# Patient Record
Sex: Female | Born: 1935 | Race: Black or African American | Hispanic: No | Marital: Single | Smoking: Never smoker
Health system: Southern US, Community
[De-identification: ages and names within clinical notes are randomized; demographics above are authoritative.]

## PROBLEM LIST (undated history)

## (undated) DIAGNOSIS — C50919 Malignant neoplasm of unspecified site of unspecified female breast: Secondary | ICD-10-CM

## (undated) DIAGNOSIS — M8580 Other specified disorders of bone density and structure, unspecified site: Secondary | ICD-10-CM

## (undated) DIAGNOSIS — K635 Polyp of colon: Secondary | ICD-10-CM

## (undated) DIAGNOSIS — I10 Essential (primary) hypertension: Secondary | ICD-10-CM

## (undated) DIAGNOSIS — F0390 Unspecified dementia without behavioral disturbance: Secondary | ICD-10-CM

## (undated) DIAGNOSIS — C189 Malignant neoplasm of colon, unspecified: Secondary | ICD-10-CM

## (undated) DIAGNOSIS — Z923 Personal history of irradiation: Secondary | ICD-10-CM

## (undated) HISTORY — DX: Other specified disorders of bone density and structure, unspecified site: M85.80

## (undated) HISTORY — DX: Polyp of colon: K63.5

## (undated) HISTORY — DX: Malignant neoplasm of unspecified site of unspecified female breast: C50.919

## (undated) HISTORY — PX: OTHER SURGICAL HISTORY: SHX169

## (undated) HISTORY — DX: Essential (primary) hypertension: I10

## (undated) HISTORY — PX: COLONOSCOPY: SHX174

## (undated) HISTORY — DX: Malignant neoplasm of colon, unspecified: C18.9

## (undated) HISTORY — PX: ABDOMINAL HYSTERECTOMY: SHX81

---

## 1988-10-19 DIAGNOSIS — I10 Essential (primary) hypertension: Secondary | ICD-10-CM

## 1988-10-19 HISTORY — DX: Essential (primary) hypertension: I10

## 1995-10-20 HISTORY — PX: BREAST BIOPSY: SHX20

## 2005-03-26 ENCOUNTER — Ambulatory Visit: Payer: Self-pay | Admitting: General Surgery

## 2005-10-19 DIAGNOSIS — C50919 Malignant neoplasm of unspecified site of unspecified female breast: Secondary | ICD-10-CM

## 2005-10-19 HISTORY — DX: Malignant neoplasm of unspecified site of unspecified female breast: C50.919

## 2005-10-19 HISTORY — PX: BREAST SURGERY: SHX581

## 2005-10-19 HISTORY — PX: BREAST BIOPSY: SHX20

## 2005-10-22 ENCOUNTER — Ambulatory Visit: Payer: Self-pay | Admitting: Family Medicine

## 2006-04-08 ENCOUNTER — Ambulatory Visit: Payer: Self-pay | Admitting: General Surgery

## 2006-04-19 ENCOUNTER — Ambulatory Visit: Payer: Self-pay | Admitting: General Surgery

## 2006-04-29 ENCOUNTER — Ambulatory Visit: Payer: Self-pay | Admitting: Gastroenterology

## 2006-05-11 ENCOUNTER — Other Ambulatory Visit: Payer: Self-pay

## 2006-05-11 ENCOUNTER — Ambulatory Visit: Payer: Self-pay | Admitting: General Surgery

## 2006-05-13 ENCOUNTER — Ambulatory Visit: Payer: Self-pay | Admitting: General Surgery

## 2006-05-26 ENCOUNTER — Ambulatory Visit: Payer: Self-pay | Admitting: Radiation Oncology

## 2006-06-19 ENCOUNTER — Ambulatory Visit: Payer: Self-pay | Admitting: Radiation Oncology

## 2006-07-19 ENCOUNTER — Ambulatory Visit: Payer: Self-pay | Admitting: Radiation Oncology

## 2006-08-19 ENCOUNTER — Ambulatory Visit: Payer: Self-pay | Admitting: Radiation Oncology

## 2006-11-04 ENCOUNTER — Ambulatory Visit: Payer: Self-pay | Admitting: General Surgery

## 2007-02-02 ENCOUNTER — Ambulatory Visit: Payer: Self-pay | Admitting: Radiation Oncology

## 2007-02-17 ENCOUNTER — Ambulatory Visit: Payer: Self-pay | Admitting: Radiation Oncology

## 2007-03-21 ENCOUNTER — Ambulatory Visit: Payer: Self-pay | Admitting: General Surgery

## 2007-08-08 ENCOUNTER — Ambulatory Visit: Payer: Self-pay | Admitting: Radiation Oncology

## 2007-08-20 ENCOUNTER — Ambulatory Visit: Payer: Self-pay | Admitting: Radiation Oncology

## 2007-10-20 DIAGNOSIS — C189 Malignant neoplasm of colon, unspecified: Secondary | ICD-10-CM

## 2007-10-20 HISTORY — PX: COLECTOMY: SHX59

## 2007-10-20 HISTORY — DX: Malignant neoplasm of colon, unspecified: C18.9

## 2008-03-19 ENCOUNTER — Ambulatory Visit: Payer: Self-pay | Admitting: General Surgery

## 2008-04-02 ENCOUNTER — Ambulatory Visit: Payer: Self-pay | Admitting: General Surgery

## 2008-04-11 ENCOUNTER — Other Ambulatory Visit: Payer: Self-pay

## 2008-04-11 ENCOUNTER — Ambulatory Visit: Payer: Self-pay | Admitting: General Surgery

## 2008-04-18 ENCOUNTER — Inpatient Hospital Stay: Payer: Self-pay | Admitting: General Surgery

## 2008-09-18 ENCOUNTER — Ambulatory Visit: Payer: Self-pay | Admitting: Radiation Oncology

## 2008-10-15 ENCOUNTER — Ambulatory Visit: Payer: Self-pay | Admitting: Radiation Oncology

## 2008-10-19 ENCOUNTER — Ambulatory Visit: Payer: Self-pay | Admitting: Radiation Oncology

## 2009-01-30 ENCOUNTER — Inpatient Hospital Stay: Payer: Self-pay | Admitting: Internal Medicine

## 2009-02-26 ENCOUNTER — Ambulatory Visit: Payer: Self-pay | Admitting: General Surgery

## 2009-03-21 ENCOUNTER — Ambulatory Visit: Payer: Self-pay | Admitting: General Surgery

## 2009-09-18 ENCOUNTER — Ambulatory Visit: Payer: Self-pay | Admitting: Radiation Oncology

## 2009-09-30 ENCOUNTER — Ambulatory Visit: Payer: Self-pay | Admitting: Radiation Oncology

## 2009-10-19 ENCOUNTER — Ambulatory Visit: Payer: Self-pay | Admitting: Radiation Oncology

## 2009-11-10 ENCOUNTER — Ambulatory Visit: Payer: Self-pay | Admitting: Internal Medicine

## 2010-03-27 ENCOUNTER — Ambulatory Visit: Payer: Self-pay | Admitting: General Surgery

## 2011-03-31 ENCOUNTER — Ambulatory Visit: Payer: Self-pay | Admitting: General Surgery

## 2011-10-27 DIAGNOSIS — H4010X0 Unspecified open-angle glaucoma, stage unspecified: Secondary | ICD-10-CM | POA: Diagnosis not present

## 2011-12-01 DIAGNOSIS — H4010X0 Unspecified open-angle glaucoma, stage unspecified: Secondary | ICD-10-CM | POA: Diagnosis not present

## 2012-01-05 DIAGNOSIS — H4010X0 Unspecified open-angle glaucoma, stage unspecified: Secondary | ICD-10-CM | POA: Diagnosis not present

## 2012-02-09 DIAGNOSIS — H4010X0 Unspecified open-angle glaucoma, stage unspecified: Secondary | ICD-10-CM | POA: Diagnosis not present

## 2012-04-04 ENCOUNTER — Ambulatory Visit: Payer: Self-pay | Admitting: General Surgery

## 2012-04-04 DIAGNOSIS — Z853 Personal history of malignant neoplasm of breast: Secondary | ICD-10-CM | POA: Diagnosis not present

## 2012-04-12 DIAGNOSIS — Z85038 Personal history of other malignant neoplasm of large intestine: Secondary | ICD-10-CM | POA: Diagnosis not present

## 2012-05-13 DIAGNOSIS — H4010X0 Unspecified open-angle glaucoma, stage unspecified: Secondary | ICD-10-CM | POA: Diagnosis not present

## 2012-06-07 DIAGNOSIS — E7800 Pure hypercholesterolemia, unspecified: Secondary | ICD-10-CM | POA: Diagnosis not present

## 2012-06-07 DIAGNOSIS — J309 Allergic rhinitis, unspecified: Secondary | ICD-10-CM | POA: Diagnosis not present

## 2012-06-07 DIAGNOSIS — R7309 Other abnormal glucose: Secondary | ICD-10-CM | POA: Diagnosis not present

## 2012-06-07 DIAGNOSIS — I10 Essential (primary) hypertension: Secondary | ICD-10-CM | POA: Diagnosis not present

## 2012-06-15 ENCOUNTER — Ambulatory Visit: Payer: Self-pay | Admitting: General Surgery

## 2012-06-15 DIAGNOSIS — Z85038 Personal history of other malignant neoplasm of large intestine: Secondary | ICD-10-CM | POA: Diagnosis not present

## 2012-06-15 DIAGNOSIS — Z803 Family history of malignant neoplasm of breast: Secondary | ICD-10-CM | POA: Diagnosis not present

## 2012-06-15 DIAGNOSIS — I10 Essential (primary) hypertension: Secondary | ICD-10-CM | POA: Diagnosis not present

## 2012-06-15 DIAGNOSIS — Z980 Intestinal bypass and anastomosis status: Secondary | ICD-10-CM | POA: Diagnosis not present

## 2012-06-15 DIAGNOSIS — Z09 Encounter for follow-up examination after completed treatment for conditions other than malignant neoplasm: Secondary | ICD-10-CM | POA: Diagnosis not present

## 2012-06-15 DIAGNOSIS — D371 Neoplasm of uncertain behavior of stomach: Secondary | ICD-10-CM | POA: Diagnosis not present

## 2012-06-15 DIAGNOSIS — Z79899 Other long term (current) drug therapy: Secondary | ICD-10-CM | POA: Diagnosis not present

## 2012-06-15 DIAGNOSIS — IMO0002 Reserved for concepts with insufficient information to code with codable children: Secondary | ICD-10-CM | POA: Diagnosis not present

## 2012-06-15 DIAGNOSIS — Z853 Personal history of malignant neoplasm of breast: Secondary | ICD-10-CM | POA: Diagnosis not present

## 2012-06-15 DIAGNOSIS — D126 Benign neoplasm of colon, unspecified: Secondary | ICD-10-CM | POA: Diagnosis not present

## 2012-06-17 LAB — PATHOLOGY REPORT

## 2012-07-22 DIAGNOSIS — Z23 Encounter for immunization: Secondary | ICD-10-CM | POA: Diagnosis not present

## 2012-10-25 DIAGNOSIS — J111 Influenza due to unidentified influenza virus with other respiratory manifestations: Secondary | ICD-10-CM | POA: Diagnosis not present

## 2012-10-25 DIAGNOSIS — K5289 Other specified noninfective gastroenteritis and colitis: Secondary | ICD-10-CM | POA: Diagnosis not present

## 2012-12-27 DIAGNOSIS — H4010X0 Unspecified open-angle glaucoma, stage unspecified: Secondary | ICD-10-CM | POA: Diagnosis not present

## 2013-01-24 DIAGNOSIS — E7800 Pure hypercholesterolemia, unspecified: Secondary | ICD-10-CM | POA: Diagnosis not present

## 2013-01-24 DIAGNOSIS — I10 Essential (primary) hypertension: Secondary | ICD-10-CM | POA: Diagnosis not present

## 2013-01-24 DIAGNOSIS — R413 Other amnesia: Secondary | ICD-10-CM | POA: Diagnosis not present

## 2013-01-31 DIAGNOSIS — H4010X0 Unspecified open-angle glaucoma, stage unspecified: Secondary | ICD-10-CM | POA: Diagnosis not present

## 2013-02-28 DIAGNOSIS — I10 Essential (primary) hypertension: Secondary | ICD-10-CM | POA: Diagnosis not present

## 2013-02-28 DIAGNOSIS — R9431 Abnormal electrocardiogram [ECG] [EKG]: Secondary | ICD-10-CM | POA: Diagnosis not present

## 2013-02-28 DIAGNOSIS — R079 Chest pain, unspecified: Secondary | ICD-10-CM | POA: Diagnosis not present

## 2013-02-28 DIAGNOSIS — F0390 Unspecified dementia without behavioral disturbance: Secondary | ICD-10-CM | POA: Diagnosis not present

## 2013-03-08 DIAGNOSIS — R9431 Abnormal electrocardiogram [ECG] [EKG]: Secondary | ICD-10-CM | POA: Diagnosis not present

## 2013-03-08 DIAGNOSIS — R42 Dizziness and giddiness: Secondary | ICD-10-CM | POA: Diagnosis not present

## 2013-03-08 DIAGNOSIS — I10 Essential (primary) hypertension: Secondary | ICD-10-CM | POA: Diagnosis not present

## 2013-03-08 DIAGNOSIS — I209 Angina pectoris, unspecified: Secondary | ICD-10-CM | POA: Diagnosis not present

## 2013-03-20 DIAGNOSIS — I10 Essential (primary) hypertension: Secondary | ICD-10-CM | POA: Diagnosis not present

## 2013-03-20 DIAGNOSIS — R011 Cardiac murmur, unspecified: Secondary | ICD-10-CM | POA: Diagnosis not present

## 2013-03-20 DIAGNOSIS — R9431 Abnormal electrocardiogram [ECG] [EKG]: Secondary | ICD-10-CM | POA: Diagnosis not present

## 2013-03-20 DIAGNOSIS — I200 Unstable angina: Secondary | ICD-10-CM | POA: Diagnosis not present

## 2013-04-04 DIAGNOSIS — R011 Cardiac murmur, unspecified: Secondary | ICD-10-CM | POA: Diagnosis not present

## 2013-04-04 DIAGNOSIS — R42 Dizziness and giddiness: Secondary | ICD-10-CM | POA: Diagnosis not present

## 2013-04-05 ENCOUNTER — Ambulatory Visit: Payer: Self-pay | Admitting: General Surgery

## 2013-04-05 DIAGNOSIS — Z853 Personal history of malignant neoplasm of breast: Secondary | ICD-10-CM | POA: Diagnosis not present

## 2013-04-05 DIAGNOSIS — R928 Other abnormal and inconclusive findings on diagnostic imaging of breast: Secondary | ICD-10-CM | POA: Diagnosis not present

## 2013-04-11 ENCOUNTER — Encounter: Payer: Self-pay | Admitting: General Surgery

## 2013-04-12 ENCOUNTER — Ambulatory Visit: Payer: Self-pay | Admitting: General Surgery

## 2013-04-24 ENCOUNTER — Encounter: Payer: Self-pay | Admitting: General Surgery

## 2013-04-24 ENCOUNTER — Ambulatory Visit (INDEPENDENT_AMBULATORY_CARE_PROVIDER_SITE_OTHER): Payer: Medicare Other | Admitting: General Surgery

## 2013-04-24 VITALS — BP 130/90 | HR 84 | Resp 12 | Ht 63.0 in | Wt 128.0 lb

## 2013-04-24 DIAGNOSIS — Z85038 Personal history of other malignant neoplasm of large intestine: Secondary | ICD-10-CM | POA: Insufficient documentation

## 2013-04-24 DIAGNOSIS — Z853 Personal history of malignant neoplasm of breast: Secondary | ICD-10-CM | POA: Diagnosis not present

## 2013-04-24 NOTE — Patient Instructions (Addendum)
Patient to return in 1 year with bilateral diagnostic mammogram.

## 2013-04-24 NOTE — Progress Notes (Signed)
Patient ID: Pamela Fox, female   DOB: [birth year], 79 y.o.   MRN: [id number]  Chief Complaint  Patient presents with  . Breast Cancer Long Term Follow Up    mammogram     HPI This is a 79 year old female here today following up from her mammogram done on 04/05/2013 with a birad category 2. Patient dose perform self breast checks and get regular mammogram. She states no complaints at this time. Here for her yearly follow up for right breast cancer and right colon cancer.  HPI  Past Medical History  Diagnosis Date  . Hypertension 1990  . Colon polyps   . Breast cancer 2007    Right, radiation treatment [facility]  . Colon cancer 2009    Past Surgical History  Procedure Laterality Date  . Colonoscopy  2009    polyps  . Colectomy  2009    colectomy for polyp wuth in-situ CA  . Salpingo oophorectmy    . Abdominal hysterectomy    . Breast surgery Right 2007    lumpectomy  . Breast biopsy  1997  . Breast biopsy Right 2007    Stereo    Family History  Problem Relation Age of Onset  . Cancer Paternal Aunt     breast    Social History History  Substance Use Topics  . Smoking status: Never Smoker   . Smokeless tobacco: Never Used  . Alcohol Use: No    No Known Allergies  Current Outpatient Prescriptions  Medication Sig Dispense Refill  . atorvastatin (LIPITOR) 40 MG tablet Take 1 tablet by mouth daily.      Marland Kitchen lisinopril-hydrochlorothiazide (PRINZIDE,ZESTORETIC) 20-12.5 MG per tablet Take 1 tablet by mouth daily.      . promethazine (PHENERGAN) 12.5 MG tablet Take 12.5 mg by mouth every 6 (six) hours as needed for nausea.       No current facility-administered medications for this visit.    Review of Systems Review of Systems  Constitutional: Negative.   Respiratory: Negative.   Cardiovascular: Negative.     Blood pressure 130/90, pulse 84, resp. rate 12, height 5\' 3"  (1.6 m), weight 128 lb (58.06 kg).  Physical Exam Physical Exam  Constitutional: She is oriented  to person, place, and time. She appears well-developed and well-nourished.  Eyes: Conjunctivae are normal. No scleral icterus.  Neck: Trachea normal. No mass and no thyromegaly present.  Cardiovascular: Normal rate, regular rhythm and normal heart sounds.   No murmur heard. Pulses:      Dorsalis pedis pulses are 2+ on the right side, and 2+ on the left side.       Posterior tibial pulses are 2+ on the right side, and 2+ on the left side.  No edema  Pulmonary/Chest: Effort normal and breath sounds normal. Right breast exhibits no inverted nipple, no mass, no nipple discharge, no skin change and no tenderness. Left breast exhibits no inverted nipple, no mass, no nipple discharge, no skin change and no tenderness.  Minimal associated scar at 11-12 o'clock of right breast from lumpectomy.  Abdominal: Soft. Normal appearance and bowel sounds are normal. There is no hepatosplenomegaly. There is no tenderness. No hernia.  Well healed mid abdominal vertical incision.   Lymphadenopathy:    She has no cervical adenopathy.    She has no axillary adenopathy.  Neurological: She is alert and oriented to person, place, and time.  Skin: Skin is warm and dry.    Data Reviewed Mammogram stable.   Assessment  Stable exam.    Plan    Continue yearly follow up.         SANKAR,SEEPLAPUTHUR G 04/24/2013, 9:15 PM

## 2013-05-09 DIAGNOSIS — R9431 Abnormal electrocardiogram [ECG] [EKG]: Secondary | ICD-10-CM | POA: Diagnosis not present

## 2013-05-09 DIAGNOSIS — R079 Chest pain, unspecified: Secondary | ICD-10-CM | POA: Diagnosis not present

## 2013-06-02 DIAGNOSIS — H4010X0 Unspecified open-angle glaucoma, stage unspecified: Secondary | ICD-10-CM | POA: Diagnosis not present

## 2013-07-11 DIAGNOSIS — E7800 Pure hypercholesterolemia, unspecified: Secondary | ICD-10-CM | POA: Diagnosis not present

## 2013-07-11 DIAGNOSIS — I10 Essential (primary) hypertension: Secondary | ICD-10-CM | POA: Diagnosis not present

## 2013-07-11 DIAGNOSIS — R413 Other amnesia: Secondary | ICD-10-CM | POA: Diagnosis not present

## 2013-07-11 DIAGNOSIS — Z23 Encounter for immunization: Secondary | ICD-10-CM | POA: Diagnosis not present

## 2013-09-21 DIAGNOSIS — H4010X0 Unspecified open-angle glaucoma, stage unspecified: Secondary | ICD-10-CM | POA: Diagnosis not present

## 2013-11-14 DIAGNOSIS — I10 Essential (primary) hypertension: Secondary | ICD-10-CM | POA: Diagnosis not present

## 2013-11-14 DIAGNOSIS — F0390 Unspecified dementia without behavioral disturbance: Secondary | ICD-10-CM | POA: Diagnosis not present

## 2013-11-14 DIAGNOSIS — Z23 Encounter for immunization: Secondary | ICD-10-CM | POA: Diagnosis not present

## 2013-11-14 DIAGNOSIS — E7800 Pure hypercholesterolemia, unspecified: Secondary | ICD-10-CM | POA: Diagnosis not present

## 2014-01-23 DIAGNOSIS — H4010X0 Unspecified open-angle glaucoma, stage unspecified: Secondary | ICD-10-CM | POA: Diagnosis not present

## 2014-03-13 DIAGNOSIS — I10 Essential (primary) hypertension: Secondary | ICD-10-CM | POA: Diagnosis not present

## 2014-03-13 DIAGNOSIS — F0390 Unspecified dementia without behavioral disturbance: Secondary | ICD-10-CM | POA: Diagnosis not present

## 2014-03-13 DIAGNOSIS — E7800 Pure hypercholesterolemia, unspecified: Secondary | ICD-10-CM | POA: Diagnosis not present

## 2014-05-03 DIAGNOSIS — I10 Essential (primary) hypertension: Secondary | ICD-10-CM | POA: Diagnosis not present

## 2014-05-03 DIAGNOSIS — E785 Hyperlipidemia, unspecified: Secondary | ICD-10-CM | POA: Diagnosis not present

## 2014-05-03 DIAGNOSIS — F03918 Unspecified dementia, unspecified severity, with other behavioral disturbance: Secondary | ICD-10-CM | POA: Diagnosis not present

## 2014-05-03 DIAGNOSIS — R002 Palpitations: Secondary | ICD-10-CM | POA: Diagnosis not present

## 2014-05-03 DIAGNOSIS — F0391 Unspecified dementia with behavioral disturbance: Secondary | ICD-10-CM | POA: Diagnosis not present

## 2014-05-08 DIAGNOSIS — Z853 Personal history of malignant neoplasm of breast: Secondary | ICD-10-CM | POA: Diagnosis not present

## 2014-05-08 DIAGNOSIS — I10 Essential (primary) hypertension: Secondary | ICD-10-CM | POA: Diagnosis not present

## 2014-05-08 DIAGNOSIS — Z09 Encounter for follow-up examination after completed treatment for conditions other than malignant neoplasm: Secondary | ICD-10-CM | POA: Diagnosis not present

## 2014-05-08 DIAGNOSIS — Z9889 Other specified postprocedural states: Secondary | ICD-10-CM | POA: Diagnosis not present

## 2014-05-09 ENCOUNTER — Encounter: Payer: Self-pay | Admitting: General Surgery

## 2014-05-16 ENCOUNTER — Encounter: Payer: Self-pay | Admitting: General Surgery

## 2014-05-16 ENCOUNTER — Ambulatory Visit (INDEPENDENT_AMBULATORY_CARE_PROVIDER_SITE_OTHER): Payer: Medicare Other | Admitting: General Surgery

## 2014-05-16 VITALS — BP 118/82 | HR 82 | Resp 12 | Ht 63.0 in | Wt 118.0 lb

## 2014-05-16 DIAGNOSIS — Z85038 Personal history of other malignant neoplasm of large intestine: Secondary | ICD-10-CM | POA: Diagnosis not present

## 2014-05-16 DIAGNOSIS — Z853 Personal history of malignant neoplasm of breast: Secondary | ICD-10-CM

## 2014-05-16 NOTE — Progress Notes (Signed)
Patient ID: Pamela Fox, female   DOB: [birth year], 80 y.o.   MRN: [id number]  Chief Complaint  Patient presents with  . Follow-up    1 year bilateral diagnostic mammogram     HPI Pamela Fox is a 80 y.o. female who presents for breast and colon cancer follow up. The most recent mammogram was done on 05/08/14. Patient does perform regular self breast checks and gets regular mammograms done. The patient denies any new problems at this time.    HPI  Past Medical History  Diagnosis Date  . Hypertension 1990  . Colon polyps   . Breast cancer 2007    Right, radiation treatment [facility]  . Colon cancer 2009    Past Surgical History  Procedure Laterality Date  . Colonoscopy  0865,7846    polyps  . Colectomy  2009    colectomy for polyp wuth in-situ CA  . Salpingo oophorectmy    . Abdominal hysterectomy    . Breast surgery Right 2007    lumpectomy  . Breast biopsy  1997  . Breast biopsy Right 2007    Stereo    Family History  Problem Relation Age of Onset  . Cancer Paternal Aunt     breast    Social History History  Substance Use Topics  . Smoking status: Never Smoker   . Smokeless tobacco: Never Used  . Alcohol Use: No    No Known Allergies  Current Outpatient Prescriptions  Medication Sig Dispense Refill  . atorvastatin (LIPITOR) 40 MG tablet Take 1 tablet by mouth daily.      . COMBIGAN 0.2-0.5 % ophthalmic solution       . donepezil (ARICEPT) 5 MG tablet       . latanoprost (XALATAN) 0.005 % ophthalmic solution       . lisinopril-hydrochlorothiazide (PRINZIDE,ZESTORETIC) 20-12.5 MG per tablet Take 1 tablet by mouth daily.       No current facility-administered medications for this visit.    Review of Systems Review of Systems  Constitutional: Negative.   Respiratory: Negative.   Cardiovascular: Negative.   Gastrointestinal: Negative.   Neurological: Negative.     Blood pressure 118/82, pulse 82, resp. rate 12, height 5\' 3"  (1.6 m), weight 118  lb (53.524 kg).  Physical Exam Physical Exam  Constitutional: She is oriented to person, place, and time. She appears well-developed and well-nourished.  Eyes: Conjunctivae are normal. No scleral icterus.  Neck: Neck supple. No thyromegaly present.  Cardiovascular: Normal rate, regular rhythm and normal heart sounds.   No murmur heard. Pulses:      Carotid pulses are 2+ on the right side, and 2+ on the left side.      Dorsalis pedis pulses are 2+ on the right side, and 2+ on the left side.       Posterior tibial pulses are 2+ on the right side, and 2+ on the left side.  No edema. Feet are warm with brisk refill.   Pulmonary/Chest: Effort normal and breath sounds normal. Right breast exhibits no inverted nipple, no mass, no nipple discharge, no skin change and no tenderness. Left breast exhibits no inverted nipple, no mass, no nipple discharge, no skin change and no tenderness.  Abdominal: Soft. Normal appearance and bowel sounds are normal. There is no hepatosplenomegaly. There is no tenderness. No hernia.  Lymphadenopathy:    She has no cervical adenopathy.    She has no axillary adenopathy.  Neurological: She is alert and oriented to person, place,  and time.  Skin: Skin is warm and dry.    Data Reviewed  Mammogram reviewed and stable.   Assessment    Stable exam. History of right breast Ca and right colon Ca.     Plan    Patient to return in 1 year with bilateral screening mammogram. Patient to have CA 27-29 and CEA drawn today. Plan to have colonoscopy done next year 2016.       Pamela Fox 05/16/2014, 11:43 AM

## 2014-05-16 NOTE — Patient Instructions (Signed)
Patient to return in 1 year with bilateral screening mammogram. Patient to have blood work drawn today. Continue self breast exams. Call office for any new breast issues or concerns.

## 2014-05-17 LAB — CANCER ANTIGEN 27.29: CA 27.29: 18.7 U/mL (ref 0.0–38.6)

## 2014-05-17 LAB — CEA: CEA: 3.3 ng/mL (ref 0.0–4.7)

## 2014-05-18 NOTE — Progress Notes (Signed)
Quick Note:  Inform pt labs are normal. F/u as scheduled ______ 

## 2014-05-21 ENCOUNTER — Telehealth: Payer: Self-pay | Admitting: *Deleted

## 2014-05-21 NOTE — Telephone Encounter (Signed)
Message copied by Chrystie Nose on Mon May 21, 2014  8:59 AM ------      Message from: Christene Lye      Created: Fri May 18, 2014  9:45 AM       Inform pt labs are normal. F/u as scheduled ------

## 2014-05-21 NOTE — Telephone Encounter (Signed)
Notified patient as instructed, patient pleased. Discussed follow-up appointments, patient agrees  

## 2014-05-27 ENCOUNTER — Emergency Department: Payer: Self-pay | Admitting: Emergency Medicine

## 2014-05-27 DIAGNOSIS — I10 Essential (primary) hypertension: Secondary | ICD-10-CM | POA: Diagnosis not present

## 2014-05-27 DIAGNOSIS — R079 Chest pain, unspecified: Secondary | ICD-10-CM | POA: Diagnosis not present

## 2014-05-27 DIAGNOSIS — R071 Chest pain on breathing: Secondary | ICD-10-CM | POA: Diagnosis not present

## 2014-05-27 DIAGNOSIS — R0789 Other chest pain: Secondary | ICD-10-CM | POA: Diagnosis not present

## 2014-05-27 DIAGNOSIS — R109 Unspecified abdominal pain: Secondary | ICD-10-CM | POA: Diagnosis not present

## 2014-05-27 LAB — CBC
HCT: 40.9 % (ref 35.0–47.0)
HGB: 13.2 g/dL (ref 12.0–16.0)
MCH: 26.7 pg (ref 26.0–34.0)
MCHC: 32.2 g/dL (ref 32.0–36.0)
MCV: 83 fL (ref 80–100)
Platelet: 206 10*3/uL (ref 150–440)
RBC: 4.94 10*6/uL (ref 3.80–5.20)
RDW: 14.9 % — ABNORMAL HIGH (ref 11.5–14.5)
WBC: 10.5 10*3/uL (ref 3.6–11.0)

## 2014-05-27 LAB — BASIC METABOLIC PANEL
ANION GAP: 13 (ref 7–16)
BUN: 13 mg/dL (ref 7–18)
CREATININE: 0.98 mg/dL (ref 0.60–1.30)
Calcium, Total: 9 mg/dL (ref 8.5–10.1)
Chloride: 98 mmol/L (ref 98–107)
Co2: 26 mmol/L (ref 21–32)
EGFR (Non-African Amer.): 56 — ABNORMAL LOW
Glucose: 149 mg/dL — ABNORMAL HIGH (ref 65–99)
OSMOLALITY: 277 (ref 275–301)
Potassium: 3.1 mmol/L — ABNORMAL LOW (ref 3.5–5.1)
Sodium: 137 mmol/L (ref 136–145)

## 2014-05-27 LAB — TROPONIN I: Troponin-I: <0.02 ng/mL

## 2014-05-29 DIAGNOSIS — H4010X0 Unspecified open-angle glaucoma, stage unspecified: Secondary | ICD-10-CM | POA: Diagnosis not present

## 2014-05-30 DIAGNOSIS — J40 Bronchitis, not specified as acute or chronic: Secondary | ICD-10-CM | POA: Diagnosis not present

## 2014-05-30 DIAGNOSIS — K2100 Gastro-esophageal reflux disease with esophagitis, without bleeding: Secondary | ICD-10-CM | POA: Diagnosis not present

## 2014-05-30 DIAGNOSIS — M940 Chondrocostal junction syndrome [Tietze]: Secondary | ICD-10-CM | POA: Diagnosis not present

## 2014-06-14 ENCOUNTER — Encounter: Payer: Self-pay | Admitting: General Surgery

## 2014-08-07 DIAGNOSIS — F0390 Unspecified dementia without behavioral disturbance: Secondary | ICD-10-CM | POA: Diagnosis not present

## 2014-08-07 DIAGNOSIS — I10 Essential (primary) hypertension: Secondary | ICD-10-CM | POA: Diagnosis not present

## 2014-08-07 DIAGNOSIS — Z23 Encounter for immunization: Secondary | ICD-10-CM | POA: Diagnosis not present

## 2014-08-20 ENCOUNTER — Encounter: Payer: Self-pay | Admitting: General Surgery

## 2014-08-27 DIAGNOSIS — H4011X3 Primary open-angle glaucoma, severe stage: Secondary | ICD-10-CM | POA: Diagnosis not present

## 2014-09-04 DIAGNOSIS — H4011X3 Primary open-angle glaucoma, severe stage: Secondary | ICD-10-CM | POA: Diagnosis not present

## 2014-11-13 DIAGNOSIS — H4011X3 Primary open-angle glaucoma, severe stage: Secondary | ICD-10-CM | POA: Diagnosis not present

## 2014-12-11 DIAGNOSIS — F0390 Unspecified dementia without behavioral disturbance: Secondary | ICD-10-CM | POA: Diagnosis not present

## 2014-12-11 DIAGNOSIS — K219 Gastro-esophageal reflux disease without esophagitis: Secondary | ICD-10-CM | POA: Diagnosis not present

## 2014-12-11 DIAGNOSIS — I10 Essential (primary) hypertension: Secondary | ICD-10-CM | POA: Diagnosis not present

## 2014-12-17 DIAGNOSIS — I10 Essential (primary) hypertension: Secondary | ICD-10-CM | POA: Diagnosis not present

## 2014-12-17 DIAGNOSIS — F0390 Unspecified dementia without behavioral disturbance: Secondary | ICD-10-CM | POA: Diagnosis not present

## 2014-12-25 DIAGNOSIS — H4011X3 Primary open-angle glaucoma, severe stage: Secondary | ICD-10-CM | POA: Diagnosis not present

## 2015-04-15 ENCOUNTER — Ambulatory Visit: Payer: Self-pay | Admitting: Family Medicine

## 2015-04-17 ENCOUNTER — Encounter: Payer: Self-pay | Admitting: Family Medicine

## 2015-04-17 ENCOUNTER — Ambulatory Visit (INDEPENDENT_AMBULATORY_CARE_PROVIDER_SITE_OTHER): Payer: Medicare Other | Admitting: Family Medicine

## 2015-04-17 VITALS — BP 110/70 | HR 94 | Temp 98.8°F | Resp 16 | Ht 63.0 in | Wt 114.2 lb

## 2015-04-17 DIAGNOSIS — I10 Essential (primary) hypertension: Secondary | ICD-10-CM | POA: Diagnosis not present

## 2015-04-17 DIAGNOSIS — R5383 Other fatigue: Secondary | ICD-10-CM | POA: Insufficient documentation

## 2015-04-17 DIAGNOSIS — T732XXA Exhaustion due to exposure, initial encounter: Secondary | ICD-10-CM | POA: Diagnosis not present

## 2015-04-17 DIAGNOSIS — E785 Hyperlipidemia, unspecified: Secondary | ICD-10-CM | POA: Diagnosis not present

## 2015-04-17 DIAGNOSIS — R634 Abnormal weight loss: Secondary | ICD-10-CM | POA: Insufficient documentation

## 2015-04-17 DIAGNOSIS — F0390 Unspecified dementia without behavioral disturbance: Secondary | ICD-10-CM | POA: Diagnosis not present

## 2015-04-17 NOTE — Patient Instructions (Signed)
F/U 4 MO 

## 2015-04-17 NOTE — Progress Notes (Signed)
Name: Pamela Fox   MRN: [id number]    DOB: 10-15-1934   Date:04/17/2015       Progress Note  Subjective  Chief Complaint  Chief Complaint  Patient presents with  . Hypertension  . Hypothyroidism  . Memory Loss    Hypertension This is a chronic problem. The current episode started more than 1 year ago. The problem is unchanged. The problem is controlled. Associated symptoms include anxiety and malaise/fatigue. Pertinent negatives include no blurred vision, chest pain, headaches, neck pain, orthopnea, palpitations or shortness of breath. There are no associated agents to hypertension. Risk factors for coronary artery disease include dyslipidemia, post-menopausal state and sedentary lifestyle. Past treatments include ACE inhibitors and diuretics. The current treatment provides moderate improvement. There are no compliance problems.   Hyperlipidemia This is a chronic problem. The current episode started more than 1 year ago. The problem is controlled. Recent lipid tests were reviewed and are normal. Pertinent negatives include no chest pain, focal weakness, myalgias or shortness of breath. Current antihyperlipidemic treatment includes statins. The current treatment provides moderate improvement of lipids. There are no compliance problems.  Risk factors for coronary artery disease include dyslipidemia, hypertension, post-menopausal and a sedentary lifestyle.    Dementia.  Patient is currently on Aricept 5 mg daily. It is only partially effective by her daughter's report.  Fatigue  Complaint of increasing fatigue. There is been no history of any fever or chills. There has been another 4 pound weight loss since she is not eating very well currently. She has not been taking her ensure shakes on a regular basis as instructed. Past Medical History  Diagnosis Date  . Hypertension 1990  . Colon polyps   . Breast cancer 2007    Right, radiation treatment [facility]  . Colon cancer 2009     History  Substance Use Topics  . Smoking status: Never Smoker   . Smokeless tobacco: Never Used  . Alcohol Use: No     Current outpatient prescriptions:  .  atorvastatin (LIPITOR) 40 MG tablet, Take 1 tablet by mouth daily., Disp: , Rfl:  .  COMBIGAN 0.2-0.5 % ophthalmic solution, , Disp: , Rfl:  .  donepezil (ARICEPT) 5 MG tablet, , Disp: , Rfl:  .  latanoprost (XALATAN) 0.005 % ophthalmic solution, , Disp: , Rfl:  .  lisinopril-hydrochlorothiazide (PRINZIDE,ZESTORETIC) 20-12.5 MG per tablet, Take 1 tablet by mouth daily., Disp: , Rfl:   No Known Allergies  Review of Systems  Constitutional: Positive for weight loss and malaise/fatigue. Negative for fever and chills.  HENT: Negative for congestion, hearing loss, sore throat and tinnitus.   Eyes: Negative for blurred vision, double vision and redness.  Respiratory: Negative for cough, hemoptysis and shortness of breath.   Cardiovascular: Negative for chest pain, palpitations, orthopnea, claudication and leg swelling.  Gastrointestinal: Negative for heartburn, nausea, vomiting, diarrhea, constipation and blood in stool.  Genitourinary: Negative for dysuria, urgency, frequency and hematuria.  Musculoskeletal: Negative for myalgias, back pain, joint pain, falls and neck pain.  Skin: Negative for itching.  Neurological: Negative for dizziness, tingling, tremors, focal weakness, seizures, loss of consciousness, weakness and headaches.  Endo/Heme/Allergies: Does not bruise/bleed easily.  Psychiatric/Behavioral: Positive for memory loss. Negative for depression and substance abuse. The patient is not nervous/anxious and does not have insomnia.      Objective  Filed Vitals:   04/17/15 1006  BP: 110/70  Pulse: 94  Temp: 98.8 F (37.1 C)  TempSrc: Oral  Resp: 16  Height: 5'  3" (1.6 m)  Weight: 114 lb 3.2 oz (51.801 kg)  SpO2: 99%     Physical Exam  Constitutional: She is oriented to person, place, and time.  Slightly  thin  HENT:  Head: Normocephalic.  Eyes: EOM are normal. Pupils are equal, round, and reactive to light.  Neck: Normal range of motion. No thyromegaly present.  Cardiovascular: Normal rate, regular rhythm and normal heart sounds.   No murmur heard. Pulmonary/Chest: Effort normal and breath sounds normal.  Abdominal: Soft. Bowel sounds are normal.  Musculoskeletal: Normal range of motion. She exhibits no edema.  Neurological: She is alert and oriented to person, place, and time. No cranial nerve deficit. Gait normal.  Skin: Skin is warm and dry. No rash noted.  Psychiatric: Affect normal.  Decreased recent memory      Assessment & Plan  1. Essential hypertension Controlled - Comprehensive metabolic panel  2. HLD (hyperlipidemia) Recheck level - Lipid panel  3. Dementia, without behavioral disturbance Worsening. Increase Aricept to 10 mg daily recheck labs  4. Hyperlipemia Lipid panel on today  5. Fatigue due to exposure, initial encounter Probably multifactorial encouraged to take 3 times a day ensure shakes - CBC with Differential/Platelet  6. Weight loss Encouraged 3 times a day ensure shakes - TSH

## 2015-04-18 ENCOUNTER — Other Ambulatory Visit: Payer: Self-pay | Admitting: General Surgery

## 2015-04-18 DIAGNOSIS — Z1231 Encounter for screening mammogram for malignant neoplasm of breast: Secondary | ICD-10-CM

## 2015-04-18 LAB — COMPREHENSIVE METABOLIC PANEL
ALBUMIN: 4.4 g/dL (ref 3.5–4.8)
ALK PHOS: 65 IU/L (ref 39–117)
ALT: 11 IU/L (ref 0–32)
AST: 15 IU/L (ref 0–40)
Albumin/Globulin Ratio: 1.7 (ref 1.1–2.5)
BUN/Creatinine Ratio: 21 (ref 11–26)
BUN: 21 mg/dL (ref 8–27)
Bilirubin Total: 0.5 mg/dL (ref 0.0–1.2)
CHLORIDE: 99 mmol/L (ref 97–108)
CO2: 25 mmol/L (ref 18–29)
Calcium: 9.7 mg/dL (ref 8.7–10.3)
Creatinine, Ser: 1.01 mg/dL — ABNORMAL HIGH (ref 0.57–1.00)
GFR calc Af Amer: 62 mL/min/{1.73_m2} (ref >59)
GFR calc non Af Amer: 53 mL/min/{1.73_m2} — ABNORMAL LOW (ref >59)
GLUCOSE: 98 mg/dL (ref 65–99)
Globulin, Total: 2.6 g/dL (ref 1.5–4.5)
POTASSIUM: 4.1 mmol/L (ref 3.5–5.2)
SODIUM: 138 mmol/L (ref 134–144)
TOTAL PROTEIN: 7 g/dL (ref 6.0–8.5)

## 2015-04-18 LAB — CBC WITH DIFFERENTIAL/PLATELET
Basophils Absolute: 0 10*3/uL (ref 0.0–0.2)
Basos: 0 %
EOS (ABSOLUTE): 0.1 10*3/uL (ref 0.0–0.4)
EOS: 1 %
Hematocrit: 36 % (ref 34.0–46.6)
Hemoglobin: 11.5 g/dL (ref 11.1–15.9)
IMMATURE GRANS (ABS): 0 10*3/uL (ref 0.0–0.1)
IMMATURE GRANULOCYTES: 0 %
LYMPHS ABS: 2.4 10*3/uL (ref 0.7–3.1)
Lymphs: 39 %
MCH: 26.6 pg (ref 26.6–33.0)
MCHC: 31.9 g/dL (ref 31.5–35.7)
MCV: 83 fL (ref 79–97)
MONOS ABS: 0.5 10*3/uL (ref 0.1–0.9)
Monocytes: 8 %
Neutrophils Absolute: 3.1 10*3/uL (ref 1.4–7.0)
Neutrophils: 52 %
Platelets: 201 10*3/uL (ref 150–379)
RBC: 4.32 x10E6/uL (ref 3.77–5.28)
RDW: 15.4 % (ref 12.3–15.4)
WBC: 6.1 10*3/uL (ref 3.4–10.8)

## 2015-04-18 LAB — LIPID PANEL
CHOL/HDL RATIO: 2.7 ratio (ref 0.0–4.4)
CHOLESTEROL TOTAL: 175 mg/dL (ref 100–199)
HDL: 66 mg/dL (ref >39)
LDL Calculated: 92 mg/dL (ref 0–99)
TRIGLYCERIDES: 83 mg/dL (ref 0–149)
VLDL Cholesterol Cal: 17 mg/dL (ref 5–40)

## 2015-04-18 LAB — TSH: TSH: 0.923 u[IU]/mL (ref 0.450–4.500)

## 2015-04-24 ENCOUNTER — Encounter: Payer: Self-pay | Admitting: Emergency Medicine

## 2015-05-10 ENCOUNTER — Ambulatory Visit
Admission: RE | Admit: 2015-05-10 | Discharge: 2015-05-10 | Disposition: A | Discharge status: Home / Self Care | Payer: Medicare Other | Source: Ambulatory Visit | Attending: General Surgery | Admitting: General Surgery

## 2015-05-10 ENCOUNTER — Other Ambulatory Visit: Payer: Self-pay | Admitting: General Surgery

## 2015-05-10 DIAGNOSIS — Z1231 Encounter for screening mammogram for malignant neoplasm of breast: Secondary | ICD-10-CM

## 2015-05-14 ENCOUNTER — Encounter: Payer: Self-pay | Admitting: General Surgery

## 2015-05-14 ENCOUNTER — Ambulatory Visit (INDEPENDENT_AMBULATORY_CARE_PROVIDER_SITE_OTHER): Payer: Medicare Other | Admitting: General Surgery

## 2015-05-14 VITALS — BP 124/70 | HR 66 | Resp 14 | Ht 63.0 in | Wt 115.0 lb

## 2015-05-14 DIAGNOSIS — Z85038 Personal history of other malignant neoplasm of large intestine: Secondary | ICD-10-CM | POA: Diagnosis not present

## 2015-05-14 DIAGNOSIS — Z853 Personal history of malignant neoplasm of breast: Secondary | ICD-10-CM | POA: Diagnosis not present

## 2015-05-14 NOTE — Progress Notes (Signed)
Patient ID: Pamela Fox, female   DOB: 09/10/1934, 81 y.o.   MRN: [id number]  Chief Complaint  Patient presents with  . Follow-up    mammogram and colonoscopy     HPI Pamela Fox is a 81 y.o. female who presents for breast and colon cancer follow up.. The most recent mammogram was done on 05/07/15 .  Patient does perform regular self breast checks and gets regular mammograms done.  Patient states no new GI problems at this time.   HPI  Past Medical History  Diagnosis Date  . Hypertension 1990  . Colon polyps   . Breast cancer 2007    Right, radiation treatment [facility]  . Colon cancer 2009    Past Surgical History  Procedure Laterality Date  . Colonoscopy  [id number]    polyps  . Colectomy  2009    colectomy for polyp wuth in-situ CA  . Salpingo oophorectmy    . Abdominal hysterectomy    . Breast surgery Right 2007    lumpectomy  . Breast biopsy  1997  . Breast biopsy Right 2007    Stereo, positive    Family History  Problem Relation Age of Onset  . Cancer Paternal Aunt     breast  . Hypertension Sister   . Hyperlipidemia Sister     Social History History  Substance Use Topics  . Smoking status: Never Smoker   . Smokeless tobacco: Never Used  . Alcohol Use: No    No Known Allergies  Current Outpatient Prescriptions  Medication Sig Dispense Refill  . atorvastatin (LIPITOR) 40 MG tablet Take 1 tablet by mouth daily.    . COMBIGAN 0.2-0.5 % ophthalmic solution     . donepezil (ARICEPT) 5 MG tablet Take 10 mg by mouth.    . latanoprost (XALATAN) 0.005 % ophthalmic solution     . lisinopril-hydrochlorothiazide (PRINZIDE,ZESTORETIC) 20-12.5 MG per tablet Take 1 tablet by mouth daily.     No current facility-administered medications for this visit.    Review of Systems Review of Systems  Constitutional: Negative.   Respiratory: Negative.   Cardiovascular: Negative.   Gastrointestinal: Negative.     Blood pressure 124/70, pulse 66, resp. rate 14,  height 5\' 3"  (1.6 m), weight 115 lb (52.164 kg).  Physical Exam Physical Exam  Constitutional: She is oriented to person, place, and time. She appears well-developed and well-nourished.  Eyes: Conjunctivae are normal. No scleral icterus.  Neck: Neck supple.  Cardiovascular: Normal rate, regular rhythm and normal heart sounds.   Pulmonary/Chest: Effort normal and breath sounds normal. Right breast exhibits no inverted nipple, no mass, no nipple discharge, no skin change and no tenderness. Left breast exhibits no inverted nipple, no mass, no nipple discharge, no skin change and no tenderness.  Abdominal: Soft. Normal appearance and bowel sounds are normal. There is no hepatomegaly. There is no tenderness.  Lymphadenopathy:    She has no cervical adenopathy.    She has no axillary adenopathy.  Neurological: She is alert and oriented to person, place, and time.  Skin: Skin is warm and dry.    Data Reviewed Mammogram reviewed. Recent labs reviewed and stable.   Assessment    History of right breast cancer and colon cancer.      Plan    Patient will be asked to return to the office in one year with a bilateral screening mammogram.   PCP:  Kathlene November 05/14/2015, 9:32 AM

## 2015-05-14 NOTE — Patient Instructions (Addendum)
  Patient will be asked to return to the office in one year with a bilateral screening mammogram. Continue monthly self breast exam, call for any changes

## 2015-06-20 ENCOUNTER — Other Ambulatory Visit: Payer: Self-pay | Admitting: Family Medicine

## 2015-06-21 ENCOUNTER — Telehealth: Payer: Self-pay | Admitting: Family Medicine

## 2015-06-21 MED ORDER — ATORVASTATIN CALCIUM 40 MG PO TABS: 40.0000 mg | ORAL_TABLET | Freq: Every day | ORAL | Status: DC

## 2015-06-21 NOTE — Telephone Encounter (Signed)
Requesting refill on Atorvastatin, she has 1 tablet left. Please send to [facility]. Last seen 04-17-15 and next appointment is 08-14-15 [phone]

## 2015-06-21 NOTE — Telephone Encounter (Signed)
Sent rx to pharmacy and called to verify receipt.

## 2015-06-21 NOTE — Telephone Encounter (Signed)
Patient informed. 

## 2015-08-06 DIAGNOSIS — H401133 Primary open-angle glaucoma, bilateral, severe stage: Secondary | ICD-10-CM | POA: Diagnosis not present

## 2015-08-08 ENCOUNTER — Encounter: Payer: Self-pay | Admitting: Family Medicine

## 2015-08-08 ENCOUNTER — Ambulatory Visit (INDEPENDENT_AMBULATORY_CARE_PROVIDER_SITE_OTHER): Payer: Medicare Other | Admitting: Family Medicine

## 2015-08-08 VITALS — BP 110/68 | HR 107 | Temp 98.4°F | Resp 16 | Ht 63.0 in | Wt 117.4 lb

## 2015-08-08 DIAGNOSIS — K581 Irritable bowel syndrome with constipation: Secondary | ICD-10-CM | POA: Diagnosis not present

## 2015-08-08 DIAGNOSIS — I10 Essential (primary) hypertension: Secondary | ICD-10-CM | POA: Diagnosis not present

## 2015-08-08 DIAGNOSIS — Z23 Encounter for immunization: Secondary | ICD-10-CM

## 2015-08-08 DIAGNOSIS — E785 Hyperlipidemia, unspecified: Secondary | ICD-10-CM

## 2015-08-08 MED ORDER — OMEPRAZOLE MAGNESIUM 20 MG PO TBEC: 20.0000 mg | DELAYED_RELEASE_TABLET | Freq: Every day | ORAL | Status: DC

## 2015-08-08 NOTE — Patient Instructions (Signed)
Irritable Bowel Syndrome, Adult Irritable bowel syndrome (IBS) is not one specific disease. It is a group of symptoms that affects the organs responsible for digestion (gastrointestinal or GI tract).  To regulate how your GI tract works, your body sends signals back and forth between your intestines and your brain. If you have IBS, there may be a problem with these signals. As a result, your GI tract does not function normally. Your intestines may become more sensitive and overreact to certain things. This is especially true when you eat certain foods or when you are under stress.  There are four types of IBS. These may be determined based on the consistency of your stool:   IBS with diarrhea.   IBS with constipation.   Mixed IBS.   Unsubtyped IBS.  It is important to know which type of IBS you have. Some treatments are more likely to be helpful for certain types of IBS.  CAUSES  The exact cause of IBS is not known. RISK FACTORS You may have a higher risk of IBS if:  You are a woman.  You are younger than 79 years old.  You have a family history of IBS.  You have mental health problems.  You have had bacterial infection of your GI tract. SIGNS AND SYMPTOMS  Symptoms of IBS vary from person to person. The main symptom is abdominal pain or discomfort. Additional symptoms usually include one or more of the following:   Diarrhea, constipation, or both.   Abdominal swelling or bloating.   Feeling full or sick after eating a small or regular-size meal.   Frequent gas.   Mucus in the stool.   A feeling of having more stool left after a bowel movement.  Symptoms tend to come and go. They may be associated with stress, psychiatric conditions, or nothing at all.  DIAGNOSIS  There is no specific test to diagnose IBS. Your health care provider will make a diagnosis based on a physical exam, medical history, and your symptoms. You may have other tests to rule out other  conditions that may be causing your symptoms. These may include:   Blood tests.   X-rays.   CT scan.  Endoscopy and colonoscopy. This is a test in which your GI tract is viewed with a long, thin, flexible tube. TREATMENT There is no cure for IBS, but treatment can help relieve symptoms. IBS treatment often includes:   Changes to your diet, such as:  Eating more fiber.  Avoiding foods that cause symptoms.  Drinking more water.  Eating regular, medium-sized portioned meals.  Medicines. These may include:  Fiber supplements if you have constipation.  Medicine to control diarrhea (antidiarrheal medicines).  Medicine to help control muscle spasms in your GI tract (antispasmodic medicines).  Medicines to help with any mental health issues, such as antidepressants or tranquilizers.  Therapy.  Talk therapy may help with anxiety, depression, or other mental health issues that can make IBS symptoms worse.  Stress reduction.  Managing your stress can help keep symptoms under control. HOME CARE INSTRUCTIONS   Take medicines only as directed by your health care provider.  Eat a healthy diet.  Avoid foods and drinks with added sugar.  Include more whole grains, fruits, and vegetables gradually into your diet. This may be especially helpful if you have IBS with constipation.  Avoid any foods and drinks that make your symptoms worse. These may include dairy products and caffeinated or carbonated drinks.  Do not eat large meals.    Drink enough fluid to keep your urine clear or pale yellow.  Exercise regularly. Ask your health care provider for recommendations of good activities for you.  Keep all follow-up visits as directed by your health care provider. This is important. SEEK MEDICAL CARE IF:   You have constant pain.  You have trouble or pain with swallowing.  You have worsening diarrhea. SEEK IMMEDIATE MEDICAL CARE IF:   You have severe and worsening abdominal  pain.   You have diarrhea and:   You have a rash, stiff neck, or severe headache.   You are irritable, sleepy, or difficult to awaken.   You are weak, dizzy, or extremely thirsty.   You have bright red blood in your stool or you have black tarry stools.   You have unusual abdominal swelling that is painful.   You vomit continuously.   You vomit blood (hematemesis).   You have both abdominal pain and a fever.    This information is not intended to replace advice given to you by your health care provider. Make sure you discuss any questions you have with your health care provider.   Document Released: 10/05/2005 Document Revised: 10/26/2014 Document Reviewed: 06/22/2014 Elsevier Interactive Patient Education 2016 [employer]  

## 2015-08-12 ENCOUNTER — Encounter: Payer: Self-pay | Admitting: Family Medicine

## 2015-08-12 NOTE — Progress Notes (Signed)
Name: Pamela CLAGETT   MRN: [id number]    DOB: 10-09-1934   Date:08/12/2015       Progress Note  Subjective  Chief Complaint  Chief Complaint  Patient presents with  . Hyperlipidemia    HPI  Hypertension   Patient presents for follow-up of hypertension. It has been present for over 5 years.  Patient states that there is compliance with medical regimen which consists of lisinopril 20-12.5  . There is no end organ disease. Cardiac risk factors include hypertension hyperlipidemia and diabetes.  Exercise regimen consist of occasional walking .  Diet consist of low-salt .  Hyperlipidemia  Patient has a history of hyperlipidemia for over 5  years.  Current medical regimen consist of atorvastatin 40 mg daily at bedtime .  Compliance is good .  Diet and exercise are currently followed usually .  Risk factors for cardiovascular disease include hyperlipidemia , hypertension, age 81 .   There have been no side effects from the medication.    Dementia history of present illness  The patient and her daughter has noticed improvement in the patient's memory.  She remains on Aricept 10 mg once daily.   Irritable bowel syndrome  Patient has some problems with intermittent constipation and diarrhea abdominal bloating. No melena or hematochezia. Colonoscopy is currently up-to-date. Past Medical History  Diagnosis Date  . Hypertension 1990  . Colon polyps   . Breast cancer [facility]) 2007    Right, radiation treatment [facility]  . Colon cancer [facility]) 2009    Social History  Substance Use Topics  . Smoking status: Never Smoker   . Smokeless tobacco: Never Used  . Alcohol Use: No     Current outpatient prescriptions:  .  atorvastatin (LIPITOR) 40 MG tablet, Take 1 tablet (40 mg total) by mouth daily., Disp: 30 tablet, Rfl: 3 .  COMBIGAN 0.2-0.5 % ophthalmic solution, , Disp: , Rfl:  .  donepezil (ARICEPT) 5 MG tablet, take 1 tablet by mouth once daily, Disp: 90 tablet, Rfl: 1 .  latanoprost  (XALATAN) 0.005 % ophthalmic solution, , Disp: , Rfl:  .  lisinopril-hydrochlorothiazide (PRINZIDE,ZESTORETIC) 20-12.5 MG per tablet, Take 1 tablet by mouth daily., Disp: , Rfl:  .  omeprazole (PRILOSEC OTC) 20 MG tablet, Take 1 tablet (20 mg total) by mouth daily., Disp: 30 tablet, Rfl: 3  No Known Allergies  Review of Systems  Constitutional: Negative for fever, chills and weight loss.  HENT: Negative for congestion, hearing loss, sore throat and tinnitus.   Eyes: Negative for blurred vision, double vision and redness.  Respiratory: Negative for cough, hemoptysis and shortness of breath.   Cardiovascular: Negative for chest pain, palpitations, orthopnea, claudication and leg swelling.  Gastrointestinal: Positive for abdominal pain and constipation. Negative for heartburn, nausea, vomiting, diarrhea and blood in stool.  Genitourinary: Negative for dysuria, urgency, frequency and hematuria.  Musculoskeletal: Negative for myalgias, back pain, joint pain, falls and neck pain.  Skin: Negative for itching.  Neurological: Negative for dizziness, tingling, tremors, focal weakness, seizures, loss of consciousness, weakness and headaches.  Endo/Heme/Allergies: Does not bruise/bleed easily.  Psychiatric/Behavioral: Positive for memory loss. Negative for depression and substance abuse. The patient is not nervous/anxious and does not have insomnia.      Objective  Filed Vitals:   08/08/15 1129  BP: 110/68  Pulse: 107  Temp: 98.4 F (36.9 C)  Resp: 16  Height: 5\' 3"  (1.6 m)  Weight: 117 lb 6 oz (53.241 kg)  SpO2: 97%  Physical Exam  Constitutional: She is oriented to person, place, and time and well-developed, well-nourished, and in no distress.  HENT:  Head: Normocephalic.  Eyes: EOM are normal. Pupils are equal, round, and reactive to light.  Neck: Normal range of motion. No thyromegaly present.  Cardiovascular: Normal rate, regular rhythm and normal heart sounds.   No murmur  heard. Pulmonary/Chest: Effort normal and breath sounds normal.  Abdominal: Soft. Bowel sounds are normal. She exhibits no distension and no mass. There is no tenderness. There is no rebound and no guarding.  Musculoskeletal: Normal range of motion. She exhibits no edema.  Neurological: She is alert and oriented to person, place, and time. No cranial nerve deficit. Gait normal.  Skin: Skin is warm and dry. No rash noted.  Psychiatric: Memory and affect normal.   Assessement and plan   1. Need for influenza vaccination Given today - Flu vaccine HIGH DOSE PF (Fluzone High dose)  2. Essential hypertension Well-controlled  3. Hyperlipidemia Well-controlled  4. Irritable bowel syndrome with constipation Handout on IBS and watch for any dietary changes that are offending

## 2015-08-14 ENCOUNTER — Ambulatory Visit: Payer: Medicare Other | Admitting: Family Medicine

## 2015-11-01 ENCOUNTER — Other Ambulatory Visit: Payer: Self-pay | Admitting: Family Medicine

## 2015-12-10 ENCOUNTER — Ambulatory Visit: Payer: Medicare Other | Admitting: Family Medicine

## 2015-12-30 ENCOUNTER — Other Ambulatory Visit: Payer: Self-pay | Admitting: Family Medicine

## 2015-12-30 MED ORDER — OMEPRAZOLE MAGNESIUM 20 MG PO TBEC: 20.0000 mg | DELAYED_RELEASE_TABLET | Freq: Every day | ORAL | Status: DC

## 2016-02-12 ENCOUNTER — Other Ambulatory Visit: Payer: Self-pay

## 2016-02-12 DIAGNOSIS — Z1231 Encounter for screening mammogram for malignant neoplasm of breast: Secondary | ICD-10-CM

## 2016-02-19 ENCOUNTER — Ambulatory Visit: Payer: Medicare Other | Admitting: Family Medicine

## 2016-02-21 DIAGNOSIS — H401133 Primary open-angle glaucoma, bilateral, severe stage: Secondary | ICD-10-CM | POA: Diagnosis not present

## 2016-03-02 ENCOUNTER — Other Ambulatory Visit: Payer: Self-pay

## 2016-03-02 MED ORDER — OMEPRAZOLE MAGNESIUM 20 MG PO TBEC: 20.0000 mg | DELAYED_RELEASE_TABLET | Freq: Every day | ORAL | Status: DC

## 2016-03-24 DIAGNOSIS — H401133 Primary open-angle glaucoma, bilateral, severe stage: Secondary | ICD-10-CM | POA: Diagnosis not present

## 2016-04-10 ENCOUNTER — Other Ambulatory Visit: Payer: Self-pay | Admitting: Family Medicine

## 2016-04-20 ENCOUNTER — Other Ambulatory Visit: Payer: Self-pay | Admitting: Family Medicine

## 2016-05-05 DIAGNOSIS — H401133 Primary open-angle glaucoma, bilateral, severe stage: Secondary | ICD-10-CM | POA: Diagnosis not present

## 2016-05-10 ENCOUNTER — Other Ambulatory Visit: Payer: Self-pay | Admitting: Family Medicine

## 2016-05-11 ENCOUNTER — Ambulatory Visit: Payer: Medicare Other

## 2016-05-12 ENCOUNTER — Other Ambulatory Visit: Payer: Self-pay | Admitting: General Surgery

## 2016-05-12 ENCOUNTER — Encounter: Payer: Self-pay | Admitting: *Deleted

## 2016-05-12 ENCOUNTER — Ambulatory Visit
Admission: RE | Admit: 2016-05-12 | Discharge: 2016-05-12 | Disposition: A | Discharge status: Home / Self Care | Payer: Medicare Other | Source: Ambulatory Visit | Attending: General Surgery | Admitting: General Surgery

## 2016-05-12 DIAGNOSIS — Z1231 Encounter for screening mammogram for malignant neoplasm of breast: Secondary | ICD-10-CM | POA: Insufficient documentation

## 2016-05-13 ENCOUNTER — Other Ambulatory Visit: Payer: Self-pay | Admitting: Family Medicine

## 2016-05-19 ENCOUNTER — Encounter: Payer: Self-pay | Admitting: General Surgery

## 2016-05-19 ENCOUNTER — Ambulatory Visit (INDEPENDENT_AMBULATORY_CARE_PROVIDER_SITE_OTHER): Payer: Medicare Other | Admitting: General Surgery

## 2016-05-19 VITALS — BP 158/78 | Ht 62.0 in | Wt 113.0 lb

## 2016-05-19 DIAGNOSIS — Z853 Personal history of malignant neoplasm of breast: Secondary | ICD-10-CM

## 2016-05-19 DIAGNOSIS — Z85038 Personal history of other malignant neoplasm of large intestine: Secondary | ICD-10-CM | POA: Diagnosis not present

## 2016-05-19 NOTE — Progress Notes (Signed)
Patient ID: Pamela Fox, female   DOB: 1934/08/13, 82 y.o.   MRN: [id number]  Chief Complaint  Patient presents with  . Follow-up    mammogram    HPI Pamela Fox is a 82 y.o. female who presents for a follow up on breast cancer (post-lumectomy and radiation) and colon cancer (post-right hemicolectomy). Patient is not currently seeing any oncologist. The most recent mammogram was done on 05-11-16. Patient does perform regular self breast checks and gets regular mammograms done. Bowels move daily and no bleeding. She has no new complaints. I have reviewed the history of present illness with the patient.  HPI  Past Medical History:  Diagnosis Date  . Breast cancer [facility]) 2007   Right, radiation treatment [facility]  . Colon cancer ([place]) 2009  . Colon polyps   . Hypertension 1990    Past Surgical History:  Procedure Laterality Date  . ABDOMINAL HYSTERECTOMY    . BREAST BIOPSY  1997  . BREAST BIOPSY Right 2007   Stereo, positive  . BREAST SURGERY Right 2007   lumpectomy  . COLECTOMY  2009   colectomy for polyp wuth in-situ [place]  . COLONOSCOPY  [id number]   polyps  . salpingo oophorectmy      Family History  Problem Relation Age of Onset  . Cancer Paternal Aunt     breast  . Hypertension Sister   . Hyperlipidemia Sister     Social History Social History  Substance Use Topics  . Smoking status: Never Smoker  . Smokeless tobacco: Never Used  . Alcohol use No    No Known Allergies  Current Outpatient Prescriptions  Medication Sig Dispense Refill  . atorvastatin (LIPITOR) 40 MG tablet Take 1 tablet (40 mg total) by mouth daily. 30 tablet 3  . COMBIGAN 0.2-0.5 % ophthalmic solution     . donepezil (ARICEPT) 5 MG tablet take 1 tablet by mouth once daily 90 tablet 0  . latanoprost (XALATAN) 0.005 % ophthalmic solution     . lisinopril-hydrochlorothiazide (PRINZIDE,ZESTORETIC) 20-12.5 MG tablet take 1 tablet by mouth once daily 90 tablet 1  . omeprazole (PRILOSEC OTC)  20 MG tablet Take 1 tablet (20 mg total) by mouth daily. 30 tablet 0   No current facility-administered medications for this visit.     Review of Systems Review of Systems  Constitutional: Negative.   Respiratory: Negative.   Cardiovascular: Negative.   Gastrointestinal: Negative.     Blood pressure (!) 158/78, height 5\' 2"  (1.575 m), weight 113 lb (51.3 kg).  Physical Exam Physical Exam  Constitutional: She is oriented to person, place, and time. She appears well-developed and well-nourished.  HENT:  Mouth/Throat: Oropharynx is clear and moist.  Eyes: Conjunctivae are normal. No scleral icterus.  Neck: Neck supple.  Cardiovascular: Normal rate, regular rhythm and normal heart sounds.   Pulmonary/Chest: Effort normal and breath sounds normal. Right breast exhibits no inverted nipple, no mass, no nipple discharge, no skin change and no tenderness. Left breast exhibits no inverted nipple, no mass, no nipple discharge, no skin change and no tenderness.    Abdominal: Soft. Normal appearance. There is no tenderness.    Lymphadenopathy:    She has no cervical adenopathy.    She has no axillary adenopathy.  Neurological: She is alert and oriented to person, place, and time.  Skin: Skin is warm and dry.  Psychiatric: Her behavior is normal.    Data Reviewed Prior notes and mammograms reviewed  Assessment    History of  right breast cancer and colon cancer. Stable exam today.     Plan    Patient will be asked to return to the office in one year with a bilateral screening mammogram.  The need for a colonoscopy will be discussed at her next visit. CA 27, 29 and CEA requested today     This information has been scribed by Pamela Fetch RN, BSN,BC.   Celisse Ciulla G 05/19/2016, 4:22 PM

## 2016-05-19 NOTE — Patient Instructions (Signed)
The patient is aware to call back for any questions or concerns. Patient will be asked to return to the office in one year with a bilateral screening mammogram. 

## 2016-05-20 ENCOUNTER — Telehealth: Payer: Self-pay | Admitting: *Deleted

## 2016-05-20 LAB — CANCER ANTIGEN 27.29: CA 27.29: 14.7 U/mL (ref 0.0–38.6)

## 2016-05-20 LAB — CEA: CEA: 2.8 ng/mL (ref 0.0–4.7)

## 2016-05-20 NOTE — Telephone Encounter (Signed)
-----   Message from Christene Lye, MD sent at 05/20/2016  8:09 AM EDT ----- Inform pt labs are normal. F/u as scheduled

## 2016-05-20 NOTE — Progress Notes (Signed)
Inform pt labs are normal. F/u as scheduled

## 2016-05-20 NOTE — Telephone Encounter (Signed)
Notified patient as instructed, patient pleased. Discussed follow-up appointments, patient agrees  

## 2016-05-25 NOTE — Telephone Encounter (Signed)
Patient need to be seen for appointment

## 2016-05-27 ENCOUNTER — Ambulatory Visit (INDEPENDENT_AMBULATORY_CARE_PROVIDER_SITE_OTHER): Payer: Medicare Other | Admitting: Family Medicine

## 2016-05-27 ENCOUNTER — Encounter: Payer: Self-pay | Admitting: Family Medicine

## 2016-05-27 VITALS — BP 122/78 | HR 74 | Temp 98.0°F | Resp 14 | Wt 114.0 lb

## 2016-05-27 DIAGNOSIS — F0390 Unspecified dementia without behavioral disturbance: Secondary | ICD-10-CM

## 2016-05-27 DIAGNOSIS — I10 Essential (primary) hypertension: Secondary | ICD-10-CM

## 2016-05-27 DIAGNOSIS — Z5181 Encounter for therapeutic drug level monitoring: Secondary | ICD-10-CM | POA: Diagnosis not present

## 2016-05-27 DIAGNOSIS — E785 Hyperlipidemia, unspecified: Secondary | ICD-10-CM | POA: Diagnosis not present

## 2016-05-27 MED ORDER — DONEPEZIL HCL 10 MG PO TABS: 10.0000 mg | ORAL_TABLET | Freq: Every day | ORAL | 6 refills | Status: DC

## 2016-05-27 MED ORDER — ATORVASTATIN CALCIUM 40 MG PO TABS: 40.0000 mg | ORAL_TABLET | Freq: Every day | ORAL | 5 refills | Status: DC

## 2016-05-27 MED ORDER — RANITIDINE HCL 150 MG PO TABS: 150.0000 mg | ORAL_TABLET | Freq: Two times a day (BID) | ORAL | 2 refills | Status: DC | PRN

## 2016-05-27 NOTE — Assessment & Plan Note (Signed)
Check fasting lipids; try to cut back on saturated fats

## 2016-05-27 NOTE — Assessment & Plan Note (Addendum)
Stable per patient/sister, increase donepezil to target dose

## 2016-05-27 NOTE — Progress Notes (Signed)
BP 122/78   Pulse 74   Temp 98 F (36.7 C) (Oral)   Resp 14   Wt 114 lb (51.7 kg)   SpO2 96%   BMI 20.85 kg/m    Subjective:    Patient ID: Pamela Fox, female    DOB: Oct 07, 1934, 82 y.o.   MRN: [id number]  HPI: Pamela Fox is a 82 y.o. female  Chief Complaint  Patient presents with  . Follow-up   Patient is new to me; his usual provider is out of the office for an extended time  Here with sister, Pamela Fox Here for follow-up  Would like urine checked (no frequency, no dysuria, no hematuria, no no increased confusion, no fever), and ears checked; ears a little itchy  She takes omeprazole just occasionally; just watches what she eats  High blood pressure; going on for years; checks BP occasionally; usually 120s; went up a little when eyes were flushed, but she wasn't taking her meds and now back on target and controlled; not adding salt; no black licorice, no decongestants  High cholesterol; on statin; no muscle aches; last lipids were June 2016; had breakfast  Dementia; memory is stable, some days better than others  Depression screen [facility] 2/9 05/27/2016 08/08/2015  Decreased Interest 0 0  Down, Depressed, Hopeless 0 0  PHQ - 2 Score 0 0   Relevant past medical, surgical, family and social history reviewed Past Medical History:  Diagnosis Date  . Breast cancer [facility]) 2007   Right, radiation treatment [facility]  . Colon cancer ([place]) 2009  . Colon polyps   . Hypertension 1990   Past Surgical History:  Procedure Laterality Date  . ABDOMINAL HYSTERECTOMY    . BREAST BIOPSY  1997  . BREAST BIOPSY Right 2007   Stereo, positive  . BREAST SURGERY Right 2007   lumpectomy  . COLECTOMY  2009   colectomy for polyp wuth in-situ [place]  . COLONOSCOPY  [id number]   polyps  . salpingo oophorectmy     Family History  Problem Relation Age of Onset  . Cancer Paternal Aunt     breast  . Hypertension Sister   . Hyperlipidemia Sister    Social History  Substance Use  Topics  . Smoking status: Never Smoker  . Smokeless tobacco: Never Used  . Alcohol use No   Interim medical history since last visit reviewed. Allergies and medications reviewed  Review of Systems Per HPI unless specifically indicated above     Objective:    BP 122/78   Pulse 74   Temp 98 F (36.7 C) (Oral)   Resp 14   Wt 114 lb (51.7 kg)   SpO2 96%   BMI 20.85 kg/m   Wt Readings from Last 3 Encounters:  05/27/16 114 lb (51.7 kg)  05/19/16 113 lb (51.3 kg)  08/08/15 117 lb 6 oz (53.2 kg)    Physical Exam  Constitutional: She appears well-developed and well-nourished. No distress.  Eyes: EOM are normal. No scleral icterus.  Neck: No thyromegaly present.  Cardiovascular: Normal rate and regular rhythm.   Pulmonary/Chest: Effort normal and breath sounds normal.  Abdominal: She exhibits no distension.  Skin: No pallor.  Psychiatric: She has a normal mood and affect. Her mood appears not anxious. She does not exhibit a depressed mood.  Somewhat flat, but cooperative; little spontaneous conversation but answered questions    Results for orders placed or performed in visit on 05/19/16  CEA  Result Value Ref Range  CEA 2.8 0.0 - 4.7 ng/mL  Cancer antigen 27.29  Result Value Ref Range   CA 27.29 14.7 0.0 - 38.6 U/mL      Assessment & Plan:   Problem List Items Addressed This Visit      Cardiovascular and Mediastinum   BP (high blood pressure)    Try DASH guidelines, continue meds      Relevant Medications   atorvastatin (LIPITOR) 40 MG tablet     Nervous and Auditory   Dementia    Stable per patient/sister, increase      Relevant Medications   donepezil (ARICEPT) 10 MG tablet     Other   Medication monitoring encounter    Check sgpt and creatinine and K+      Relevant Orders   Comprehensive Metabolic Panel (CMET)   Hyperlipemia - Primary    Check fasting lipids; try to cut back on saturated fats      Relevant Medications   atorvastatin  (LIPITOR) 40 MG tablet   Other Relevant Orders   Lipid panel    Other Visit Diagnoses   None.      Follow up plan: Return in about 6 months (around 11/27/2016) for Dr. Rutherford Nail, follow-up.  An after-visit summary was printed and given to the patient at [name].  Please see the patient instructions which may contain other information and recommendations beyond what is mentioned above in the assessment and plan.  Meds ordered this encounter  Medications  . ranitidine (ZANTAC) 150 MG tablet    Sig: Take 1 tablet (150 mg total) by mouth 2 (two) times daily as needed for heartburn. (this replaces omeprazole)    Dispense:  60 tablet    Refill:  2  . atorvastatin (LIPITOR) 40 MG tablet    Sig: Take 1 tablet (40 mg total) by mouth at bedtime.    Dispense:  30 tablet    Refill:  5  . donepezil (ARICEPT) 10 MG tablet    Sig: Take 1 tablet (10 mg total) by mouth at bedtime.    Dispense:  30 tablet    Refill:  6    Orders Placed This Encounter  Procedures  . Comprehensive Metabolic Panel (CMET)  . Lipid panel

## 2016-05-27 NOTE — Assessment & Plan Note (Signed)
Check sgpt and creatinine and K+

## 2016-05-27 NOTE — Patient Instructions (Addendum)
Try to limit saturated fats in your diet (bologna, hot dogs, barbeque, cheeseburgers, hamburgers, steak, bacon, sausage, cheese, etc.) and get more fresh fruits, vegetables, and whole grains  Your goal blood pressure is less than 150 mmHg on top. Try to follow the DASH guidelines (DASH stands for Dietary Approaches to Stop Hypertension) Try to limit the sodium in your diet.  Ideally, consume less than 1.5 grams (less than 1,500mg ) per day. Do not add salt when cooking or at the table.  Check the sodium amount on labels when shopping, and choose items lower in sodium when given a choice. Avoid or limit foods that already contain a lot of sodium. Eat a diet rich in fruits and vegetables and whole grains.  DASH Eating Plan DASH stands for "Dietary Approaches to Stop Hypertension." The DASH eating plan is a healthy eating plan that has been shown to reduce high blood pressure (hypertension). Additional health benefits may include reducing the risk of type 2 diabetes mellitus, heart disease, and stroke. The DASH eating plan may also help with weight loss. WHAT DO I NEED TO KNOW ABOUT THE DASH EATING PLAN? For the DASH eating plan, you will follow these general guidelines:  Choose foods with a percent daily value for sodium of less than 5% (as listed on the food label).  Use salt-free seasonings or herbs instead of table salt or sea salt.  Check with your health care provider or pharmacist before using salt substitutes.  Eat lower-sodium products, often labeled as "lower sodium" or "no salt added."  Eat fresh foods.  Eat more vegetables, fruits, and low-fat dairy products.  Choose whole grains. Look for the word "whole" as the first word in the ingredient list.  Choose fish and skinless chicken or [place] more often than red meat. Limit fish, poultry, and meat to 6 oz (170 g) each day.  Limit sweets, desserts, sugars, and sugary drinks.  Choose heart-healthy fats.  Limit cheese to 1 oz (28  g) per day.  Eat more home-cooked food and less restaurant, buffet, and fast food.  Limit fried foods.  Cook foods using methods other than frying.  Limit canned vegetables. If you do use them, rinse them well to decrease the sodium.  When eating at a restaurant, ask that your food be prepared with less salt, or no salt if possible. WHAT FOODS CAN I EAT? Seek help from a dietitian for individual calorie needs. Grains Whole grain or whole wheat bread. Brown rice. Whole grain or whole wheat pasta. Quinoa, bulgur, and whole grain cereals. Low-sodium cereals. Corn or whole wheat flour tortillas. Whole grain cornbread. Whole grain crackers. Low-sodium crackers. Vegetables Fresh or frozen vegetables (raw, steamed, roasted, or grilled). Low-sodium or reduced-sodium tomato and vegetable juices. Low-sodium or reduced-sodium tomato sauce and paste. Low-sodium or reduced-sodium canned vegetables.  Fruits All fresh, canned (in natural juice), or frozen fruits. Meat and Other Protein Products Ground beef (85% or leaner), grass-fed beef, or beef trimmed of fat. Skinless chicken or [place]. Ground chicken or [place]. Pork trimmed of fat. All fish and seafood. Eggs. Dried beans, peas, or lentils. Unsalted nuts and seeds. Unsalted canned beans. Dairy Low-fat dairy products, such as skim or 1% milk, 2% or reduced-fat cheeses, low-fat ricotta or cottage cheese, or plain low-fat yogurt. Low-sodium or reduced-sodium cheeses. Fats and Oils Tub margarines without trans fats. Light or reduced-fat mayonnaise and salad dressings (reduced sodium). Avocado. Safflower, olive, or canola oils. Natural peanut or almond butter. Other Unsalted popcorn and pretzels. The  items listed above may not be a complete list of recommended foods or beverages. Contact your dietitian for more options. WHAT FOODS ARE NOT RECOMMENDED? Grains White bread. White pasta. White rice. Refined cornbread. Bagels and croissants. Crackers that  contain trans fat. Vegetables Creamed or fried vegetables. Vegetables in a cheese sauce. Regular canned vegetables. Regular canned tomato sauce and paste. Regular tomato and vegetable juices. Fruits Dried fruits. Canned fruit in light or heavy syrup. Fruit juice. Meat and Other Protein Products Fatty cuts of meat. Ribs, chicken wings, bacon, sausage, bologna, salami, chitterlings, fatback, hot dogs, bratwurst, and packaged luncheon meats. Salted nuts and seeds. Canned beans with salt. Dairy Whole or 2% milk, cream, half-and-half, and cream cheese. Whole-fat or sweetened yogurt. Full-fat cheeses or blue cheese. Nondairy creamers and whipped toppings. Processed cheese, cheese spreads, or cheese curds. Condiments Onion and garlic salt, seasoned salt, table salt, and sea salt. Canned and packaged gravies. Worcestershire sauce. Tartar sauce. Barbecue sauce. Teriyaki sauce. Soy sauce, including reduced sodium. Steak sauce. Fish sauce. Oyster sauce. Cocktail sauce. Horseradish. Ketchup and mustard. Meat flavorings and tenderizers. Bouillon cubes. Hot sauce. Tabasco sauce. Marinades. Taco seasonings. Relishes. Fats and Oils Butter, stick margarine, lard, shortening, ghee, and bacon fat. Coconut, palm kernel, or palm oils. Regular salad dressings. Other Pickles and olives. Salted popcorn and pretzels. The items listed above may not be a complete list of foods and beverages to avoid. Contact your dietitian for more information. WHERE CAN I FIND MORE INFORMATION? National Heart, Lung, and Blood Institute: travelstabloid.com   This information is not intended to replace advice given to you by your health care provider. Make sure you discuss any questions you have with your health care provider.   Document Released: 09/24/2011 Document Revised: 10/26/2014 Document Reviewed: 08/09/2013 Elsevier Interactive Patient Education [employer].

## 2016-05-27 NOTE — Assessment & Plan Note (Signed)
Try DASH guidelines, continue meds

## 2016-06-09 ENCOUNTER — Other Ambulatory Visit: Payer: Self-pay | Admitting: Family Medicine

## 2016-06-11 DIAGNOSIS — Z5181 Encounter for therapeutic drug level monitoring: Secondary | ICD-10-CM | POA: Diagnosis not present

## 2016-06-11 DIAGNOSIS — E785 Hyperlipidemia, unspecified: Secondary | ICD-10-CM | POA: Diagnosis not present

## 2016-06-12 LAB — COMPREHENSIVE METABOLIC PANEL
ALK PHOS: 60 IU/L (ref 39–117)
ALT: 11 IU/L (ref 0–32)
AST: 16 IU/L (ref 0–40)
Albumin/Globulin Ratio: 1.5 (ref 1.2–2.2)
Albumin: 4.3 g/dL (ref 3.5–4.8)
BILIRUBIN TOTAL: 0.4 mg/dL (ref 0.0–1.2)
BUN/Creatinine Ratio: 18 (ref 12–28)
BUN: 16 mg/dL (ref 8–27)
CHLORIDE: 98 mmol/L (ref 96–106)
CO2: 25 mmol/L (ref 18–29)
Calcium: 9.5 mg/dL (ref 8.7–10.3)
Creatinine, Ser: 0.9 mg/dL (ref 0.57–1.00)
GFR calc Af Amer: 70 mL/min/{1.73_m2} (ref >59)
GFR calc non Af Amer: 61 mL/min/{1.73_m2} (ref >59)
GLUCOSE: 81 mg/dL (ref 65–99)
Globulin, Total: 2.9 g/dL (ref 1.5–4.5)
Potassium: 4.1 mmol/L (ref 3.5–5.2)
Sodium: 138 mmol/L (ref 134–144)
Total Protein: 7.2 g/dL (ref 6.0–8.5)

## 2016-06-12 LAB — LIPID PANEL
CHOLESTEROL TOTAL: 170 mg/dL (ref 100–199)
Chol/HDL Ratio: 2.5 ratio units (ref 0.0–4.4)
HDL: 68 mg/dL (ref >39)
LDL Calculated: 87 mg/dL (ref 0–99)
TRIGLYCERIDES: 75 mg/dL (ref 0–149)
VLDL CHOLESTEROL CAL: 15 mg/dL (ref 5–40)

## 2016-07-09 ENCOUNTER — Other Ambulatory Visit: Payer: Self-pay | Admitting: Family Medicine

## 2016-07-31 ENCOUNTER — Other Ambulatory Visit: Payer: Self-pay | Admitting: Family Medicine

## 2016-07-31 NOTE — Telephone Encounter (Signed)
Reviewed labs from Aug 2017, six month Rx approved

## 2016-08-03 ENCOUNTER — Telehealth: Payer: Self-pay | Admitting: Family Medicine

## 2016-08-03 NOTE — Telephone Encounter (Signed)
Called Pt to schedule AWV with [facility] and CPE with PCP for 10/23, pt to call back once she has reviewed her schedule. - knb

## 2016-08-14 DIAGNOSIS — H401133 Primary open-angle glaucoma, bilateral, severe stage: Secondary | ICD-10-CM | POA: Diagnosis not present

## 2016-09-29 ENCOUNTER — Other Ambulatory Visit: Payer: Self-pay | Admitting: Family Medicine

## 2016-09-29 NOTE — Telephone Encounter (Signed)
Request received for PPI; no diagnosis of GERD or esophagitis or Barrett's in problem list or history PPI denied

## 2016-11-16 ENCOUNTER — Other Ambulatory Visit: Payer: Self-pay | Admitting: Family Medicine

## 2016-11-18 NOTE — Telephone Encounter (Signed)
Last Cr and -lytes reviewed; Rx approved 

## 2016-11-30 ENCOUNTER — Telehealth: Payer: Self-pay | Admitting: Family Medicine

## 2016-11-30 ENCOUNTER — Encounter: Payer: Self-pay | Admitting: Family Medicine

## 2016-11-30 ENCOUNTER — Ambulatory Visit (INDEPENDENT_AMBULATORY_CARE_PROVIDER_SITE_OTHER): Payer: Medicare Other | Admitting: Family Medicine

## 2016-11-30 VITALS — BP 108/62 | HR 84 | Temp 98.1°F | Resp 14 | Wt 113.0 lb

## 2016-11-30 DIAGNOSIS — Z85038 Personal history of other malignant neoplasm of large intestine: Secondary | ICD-10-CM

## 2016-11-30 DIAGNOSIS — E782 Mixed hyperlipidemia: Secondary | ICD-10-CM

## 2016-11-30 DIAGNOSIS — Z5181 Encounter for therapeutic drug level monitoring: Secondary | ICD-10-CM

## 2016-11-30 DIAGNOSIS — I10 Essential (primary) hypertension: Secondary | ICD-10-CM

## 2016-11-30 DIAGNOSIS — Z7189 Other specified counseling: Secondary | ICD-10-CM | POA: Diagnosis not present

## 2016-11-30 DIAGNOSIS — F0390 Unspecified dementia without behavioral disturbance: Secondary | ICD-10-CM | POA: Diagnosis not present

## 2016-11-30 DIAGNOSIS — Z853 Personal history of malignant neoplasm of breast: Secondary | ICD-10-CM | POA: Diagnosis not present

## 2016-11-30 MED ORDER — LISINOPRIL-HYDROCHLOROTHIAZIDE 20-12.5 MG PO TABS: 0.5000 | ORAL_TABLET | Freq: Every day | ORAL | 1 refills | Status: DC

## 2016-11-30 NOTE — Assessment & Plan Note (Addendum)
Well-controlled; will decrease BP medicine and recheck when she comes back for labs

## 2016-11-30 NOTE — Telephone Encounter (Signed)
Please let family/caregiver know that I'd like to reduce her blood pressure medicine, since her reading was so good today; let's have her take just half of a BP pill daily (the lisinopril/hctz) and then we can recheck her BP when she comes in for labs in early March Thank you

## 2016-11-30 NOTE — Progress Notes (Signed)
BP 108/62   Pulse 84   Temp 98.1 F (36.7 C) (Oral)   Resp 14   Wt 113 lb (51.3 kg)   SpO2 96%   BMI 20.67 kg/m    Subjective:    Patient ID: Pamela Fox, female    DOB: 1934/05/28, 83 y.o.   MRN: [id number]  HPI: Pamela Fox is a 83 y.o. female  Chief Complaint  Patient presents with  . Follow-up    Here for follow-up No medical excitement since last visit Hx of breast cancer; sees Dr. Jamal Collin once a year Hx of colon cancer; sees Dr. Jamal Collin once a year No lumps in the breast No blood in the stool; no abd pain; sister says she has good appetite Glaucoma followed by Dr. Suezanne Jacquet in [place], vision okay for her to get around Memory is stable, on aricept; no GI side effects Family got her a stabilized bicycle, very sturdy  No labs due today Last cholesterol was 170 total, HDL 68, LDL 87; on statin CA 27.29 normal, last TSH normal  We talked about DNR status; she is here with sister; sister says she is her POA and HCPOA, and she's happy to bring that paperwork in to the office if not on the chart  Depression screen [facility] 2/9 11/30/2016 05/27/2016 08/08/2015  Decreased Interest 0 0 0  Down, Depressed, Hopeless 0 0 0  PHQ - 2 Score 0 0 0   Relevant past medical, surgical, family and social history reviewed Past Medical History:  Diagnosis Date  . Breast cancer [facility]) 2007   Right, radiation treatment [facility]  . Colon cancer ([place]) 2009  . Colon polyps   . Hypertension 1990   Past Surgical History:  Procedure Laterality Date  . ABDOMINAL HYSTERECTOMY    . BREAST BIOPSY  1997  . BREAST BIOPSY Right 2007   Stereo, positive  . BREAST SURGERY Right 2007   lumpectomy  . COLECTOMY  2009   colectomy for polyp wuth in-situ CA  . COLONOSCOPY  [id number]   polyps  . salpingo oophorectmy     Family History  Problem Relation Age of Onset  . Cancer Paternal Aunt     breast  . Hypertension Sister   . Hyperlipidemia Sister    Social History  Substance Use Topics  .  Smoking status: Never Smoker  . Smokeless tobacco: Never Used  . Alcohol use No   Interim medical history since last visit reviewed. Allergies and medications reviewed  Review of Systems Per HPI unless specifically indicated above     Objective:    BP 108/62   Pulse 84   Temp 98.1 F (36.7 C) (Oral)   Resp 14   Wt 113 lb (51.3 kg)   SpO2 96%   BMI 20.67 kg/m   Wt Readings from Last 3 Encounters:  11/30/16 113 lb (51.3 kg)  05/27/16 114 lb (51.7 kg)  05/19/16 113 lb (51.3 kg)    Physical Exam  Constitutional: She appears well-developed and well-nourished. No distress.  Elderly female; no distress; weight stable  Eyes: EOM are normal. No scleral icterus.  Neck: No thyromegaly present.  Cardiovascular: Normal rate and regular rhythm.   Pulmonary/Chest: Effort normal and breath sounds normal.  Abdominal: Normal appearance and bowel sounds are normal. She exhibits no distension. There is no tenderness.  Skin: No pallor.  Psychiatric: She has a normal mood and affect. Her mood appears not anxious. She does not exhibit a depressed mood.  Somewhat flat,  but cooperative; little spontaneous conversation but answered questions   Results for orders placed or performed in visit on 05/27/16  Comprehensive Metabolic Panel (CMET)  Result Value Ref Range   Glucose 81 65 - 99 mg/dL   BUN 16 8 - 27 mg/dL   Creatinine, Ser 0.90 0.57 - 1.00 mg/dL   GFR calc non Af Amer 61 >59 mL/min/1.73   GFR calc Af Amer 70 >59 mL/min/1.73   BUN/Creatinine Ratio 18 12 - 28   Sodium 138 134 - 144 mmol/L   Potassium 4.1 3.5 - 5.2 mmol/L   Chloride 98 96 - 106 mmol/L   CO2 25 18 - 29 mmol/L   Calcium 9.5 8.7 - 10.3 mg/dL   Total Protein 7.2 6.0 - 8.5 g/dL   Albumin 4.3 3.5 - 4.8 g/dL   Globulin, Total 2.9 1.5 - 4.5 g/dL   Albumin/Globulin Ratio 1.5 1.2 - 2.2   Bilirubin Total 0.4 0.0 - 1.2 mg/dL   Alkaline Phosphatase 60 39 - 117 IU/L   AST 16 0 - 40 IU/L   ALT 11 0 - 32 IU/L  Lipid panel    Result Value Ref Range   Cholesterol, Total 170 100 - 199 mg/dL   Triglycerides 75 0 - 149 mg/dL   HDL 68 >39 mg/dL   VLDL Cholesterol Cal 15 5 - 40 mg/dL   LDL Calculated 87 0 - 99 mg/dL   Chol/HDL Ratio 2.5 0.0 - 4.4 ratio units      Assessment & Plan:   Problem List Items Addressed This Visit    None       Follow up plan: No Follow-up on file.  An after-visit summary was printed and given to the patient at [place].  Please see the patient instructions which may contain other information and recommendations beyond what is mentioned above in the assessment and plan.  Meds ordered this encounter  Medications  . dorzolamide (TRUSOPT) 2 % ophthalmic solution    Refill:  0    No orders of the defined types were placed in this encounter.

## 2016-11-30 NOTE — Assessment & Plan Note (Signed)
Reviewed last lipid panel; continue statin; check labs in March

## 2016-11-30 NOTE — Assessment & Plan Note (Signed)
Monitored by Dr. Jamal Collin

## 2016-11-30 NOTE — Assessment & Plan Note (Signed)
Sister reports she is stable; continue medicine

## 2016-11-30 NOTE — Patient Instructions (Addendum)
Return for fasting labs in early March  Advance Directive Advance directives are the legal documents that allow you to make choices about your health care and medical treatment if you cannot speak for yourself. Advance directives are a way for you to communicate your wishes to family, friends, and health care providers. The specified people can then convey your decisions about end-of-life care to avoid confusion if you should become unable to communicate. Ideally, the process of discussing and writing advance directives should happen over time rather than making decisions all at once. Advance directives can be modified as your situation changes, and you can change your mind at any time, even after you have signed the advance directives. Each state has its own laws regarding advance directives. You may want to check with your health care provider, attorney, or state representative about the law in your state. Below are some examples of advance directives. HEALTH CARE PROXY AND DURABLE POWER OF ATTORNEY FOR HEALTH CARE A health care proxy is a person (agent) appointed to make medical decisions for you if you cannot. Generally, people choose someone they know well and trust to represent their preferences when they can no longer do so. You should be sure to ask this person for agreement to act as your agent. An agent may have to exercise judgment in the event of a medical decision for which your wishes are not known. A durable power of attorney for health care is a legal document that names your health care proxy. Depending on the laws in your state, after the document is written, it may also need to be:  Signed.  Notarized.  Dated.  Copied.  Witnessed.  Incorporated into your medical record. You may also want to appoint someone to manage your financial affairs if you cannot. This is called a durable power of attorney for finances. It is a separate legal document from the durable power of attorney  for health care. You may choose the same person or someone different from your health care proxy to act as your agent in financial matters. LIVING WILL A living will is a set of instructions documenting your wishes about medical care when you cannot care for yourself. It is used if you become:  Terminally ill.  Incapacitated.  Unable to communicate.  Unable to make decisions. Items to consider in your living will include:  The use or non-use of life-sustaining equipment, such as dialysis machines and breathing machines (ventilators).  A do not resuscitate (DNR) order, which is the instruction not to use cardiopulmonary resuscitation (CPR) if breathing or heartbeat stops.  Tube feeding.  Withholding of food and fluids.  Comfort (palliative) care when the goal becomes comfort rather than a cure.  Organ and tissue donation. A living will does not give instructions about distribution of your money and property if you should pass away. It is advisable to seek the expert advice of a lawyer in drawing up a will regarding your possessions. Decisions about taxes, beneficiaries, and asset distribution will be legally binding. This process can relieve your family and friends of any burdens surrounding disputes or questions that may come up about the allocation of your assets. DO NOT RESUSCITATE (DNR) A do not resuscitate (DNR) order is a request to not have CPR in the event that your heart stops beating or you stop breathing. Unless given other instructions, a health care provider will try to help any patient whose heart has stopped or who has stopped breathing.  This information  is not intended to replace advice given to you by your health care provider. Make sure you discuss any questions you have with your health care provider. Document Released: 01/12/2008 Document Revised: 01/27/2016 Document Reviewed: 02/22/2013 Elsevier Interactive Patient Education  2017 [employer].

## 2016-11-30 NOTE — Assessment & Plan Note (Signed)
Monitor sgpt and Cr 

## 2016-12-01 NOTE — Telephone Encounter (Signed)
Notified patient and also contacted and left detailed voicemail with sister [name].

## 2016-12-08 ENCOUNTER — Other Ambulatory Visit: Payer: Self-pay

## 2016-12-11 DIAGNOSIS — H401133 Primary open-angle glaucoma, bilateral, severe stage: Secondary | ICD-10-CM | POA: Diagnosis not present

## 2017-01-02 DIAGNOSIS — R112 Nausea with vomiting, unspecified: Secondary | ICD-10-CM | POA: Diagnosis not present

## 2017-02-08 ENCOUNTER — Telehealth: Payer: Self-pay

## 2017-02-08 MED ORDER — RANITIDINE HCL 300 MG PO TABS: 300.0000 mg | ORAL_TABLET | Freq: Every day | ORAL | 5 refills | Status: DC

## 2017-02-08 NOTE — Telephone Encounter (Signed)
Pt asking to be back on omeprazole  20 mg caps

## 2017-02-08 NOTE — Telephone Encounter (Signed)
Left detailed voicemail

## 2017-02-08 NOTE — Telephone Encounter (Signed)
Please remind patient / responsible party that she's due for labs I'll switch from ranitidine 150 mg BID to 300 mg QHS If symptoms persist, then appt please

## 2017-02-15 ENCOUNTER — Other Ambulatory Visit: Payer: Self-pay

## 2017-02-15 DIAGNOSIS — Z1231 Encounter for screening mammogram for malignant neoplasm of breast: Secondary | ICD-10-CM

## 2017-02-15 DIAGNOSIS — I10 Essential (primary) hypertension: Secondary | ICD-10-CM

## 2017-02-15 MED ORDER — DONEPEZIL HCL 10 MG PO TABS: 10.0000 mg | ORAL_TABLET | Freq: Every day | ORAL | 6 refills | Status: DC

## 2017-02-15 NOTE — Telephone Encounter (Signed)
She should not be out of lisinopril/hctz Please resolve that with pharmacy I sent the aricept

## 2017-02-16 ENCOUNTER — Other Ambulatory Visit: Payer: Self-pay | Admitting: Family Medicine

## 2017-02-16 ENCOUNTER — Ambulatory Visit: Payer: Medicare Other

## 2017-02-16 VITALS — BP 118/74 | HR 88

## 2017-02-16 DIAGNOSIS — E782 Mixed hyperlipidemia: Secondary | ICD-10-CM | POA: Diagnosis not present

## 2017-02-16 DIAGNOSIS — Z5181 Encounter for therapeutic drug level monitoring: Secondary | ICD-10-CM | POA: Diagnosis not present

## 2017-02-16 DIAGNOSIS — I10 Essential (primary) hypertension: Secondary | ICD-10-CM

## 2017-02-16 LAB — CMP 10231
AG Ratio: 1.4 Ratio (ref 1.0–2.5)
ALK PHOS: 58 U/L (ref 33–130)
ALT: 10 U/L (ref 6–29)
AST: 16 U/L (ref 10–35)
Albumin: 4.3 g/dL (ref 3.6–5.1)
BUN/Creatinine Ratio: 15.1 Ratio (ref 6–22)
BUN: 16 mg/dL (ref 7–25)
CALCIUM: 9.6 mg/dL (ref 8.6–10.4)
CHLORIDE: 101 mmol/L (ref 98–110)
CO2: 21 mmol/L (ref 20–31)
Creat: 1.06 mg/dL — ABNORMAL HIGH (ref 0.60–0.88)
GFR, EST AFRICAN AMERICAN: 57 mL/min — AB (ref >=60)
GFR, EST NON AFRICAN AMERICAN: 50 mL/min — AB (ref >=60)
Globulin: 3.1 g/dL (ref 1.9–3.7)
Glucose, Bld: 95 mg/dL (ref 65–99)
Potassium: 4.4 mmol/L (ref 3.5–5.3)
Sodium: 139 mmol/L (ref 135–146)
Total Bilirubin: 0.5 mg/dL (ref 0.2–1.2)
Total Protein: 7.4 g/dL (ref 6.1–8.1)

## 2017-02-16 LAB — LIPID PANEL
CHOLESTEROL: 210 mg/dL — AB (ref <200)
HDL: 74 mg/dL (ref >50)
LDL CALC: 116 mg/dL — AB (ref <100)
Total CHOL/HDL Ratio: 2.8 Ratio (ref <5.0)
Triglycerides: 101 mg/dL (ref <150)
VLDL: 20 mg/dL (ref <30)

## 2017-02-17 ENCOUNTER — Other Ambulatory Visit: Payer: Self-pay | Admitting: Family Medicine

## 2017-02-17 MED ORDER — ATORVASTATIN CALCIUM 40 MG PO TABS: 40.0000 mg | ORAL_TABLET | Freq: Every day | ORAL | 5 refills | Status: DC

## 2017-02-17 NOTE — Progress Notes (Signed)
Sent in refills of statin Suspect noncompliance, as she should have run out of this medicine in Feb if filling regularly

## 2017-04-16 DIAGNOSIS — H401133 Primary open-angle glaucoma, bilateral, severe stage: Secondary | ICD-10-CM | POA: Diagnosis not present

## 2017-05-14 ENCOUNTER — Ambulatory Visit
Admission: RE | Admit: 2017-05-14 | Discharge: 2017-05-14 | Disposition: A | Discharge status: Home / Self Care | Payer: Medicare Other | Source: Ambulatory Visit | Attending: General Surgery | Admitting: General Surgery

## 2017-05-14 DIAGNOSIS — Z853 Personal history of malignant neoplasm of breast: Secondary | ICD-10-CM | POA: Diagnosis not present

## 2017-05-14 DIAGNOSIS — Z1231 Encounter for screening mammogram for malignant neoplasm of breast: Secondary | ICD-10-CM | POA: Diagnosis not present

## 2017-05-14 HISTORY — DX: Personal history of irradiation: Z92.3

## 2017-05-24 ENCOUNTER — Ambulatory Visit (INDEPENDENT_AMBULATORY_CARE_PROVIDER_SITE_OTHER): Payer: Medicare Other | Admitting: General Surgery

## 2017-05-24 ENCOUNTER — Encounter: Payer: Self-pay | Admitting: General Surgery

## 2017-05-24 VITALS — BP 122/66 | HR 68 | Resp 12 | Ht 61.0 in | Wt 112.0 lb

## 2017-05-24 DIAGNOSIS — Z853 Personal history of malignant neoplasm of breast: Secondary | ICD-10-CM

## 2017-05-24 NOTE — Progress Notes (Signed)
Patient ID: Pamela Fox, female   DOB: [birth year], 83 y.o.   MRN: [id number]  Chief Complaint  Patient presents with  . Follow-up    Colonoscopy and mammogram    HPI Pamela Fox is a 83 y.o. female who presents for  Breast and colon cancer follow up. The most recent mammogram was done on 05/14/17.  Patient does perform regular self breast checks and gets regular mammograms done. Patient states she is doing well. Moving bowels. Her last colonoscopy done in 2013. Denies any gastrointestinal issues.    HPI  Past Medical History:  Diagnosis Date  . Breast cancer Pamela Fox) 2007   Right, radiation treatment [facility]  . Colon cancer ([place]) 2009  . Colon polyps   . Hypertension 1990  . Personal history of radiation therapy     Past Surgical History:  Procedure Laterality Date  . ABDOMINAL HYSTERECTOMY    . BREAST BIOPSY  1997  . BREAST BIOPSY Right 2007   Stereo, positive  . BREAST SURGERY Right 2007   lumpectomy  . COLECTOMY  2009   colectomy for polyp wuth in-situ [place]  . COLONOSCOPY  [id number]   polyps  . salpingo oophorectmy      Family History  Problem Relation Age of Onset  . Cancer Paternal Aunt        breast  . Hypertension Sister   . Hyperlipidemia Sister   . Breast cancer Neg Hx     Social History Social History  Substance Use Topics  . Smoking status: Never Smoker  . Smokeless tobacco: Never Used  . Alcohol use No    No Known Allergies  Current Outpatient Prescriptions  Medication Sig Dispense Refill  . atorvastatin (LIPITOR) 40 MG tablet Take 1 tablet (40 mg total) by mouth at bedtime. 30 tablet 5  . COMBIGAN 0.2-0.5 % ophthalmic solution     . donepezil (ARICEPT) 10 MG tablet Take 1 tablet (10 mg total) by mouth at bedtime. 30 tablet 6  . dorzolamide (TRUSOPT) 2 % ophthalmic solution   0  . latanoprost (XALATAN) 0.005 % ophthalmic solution     . lisinopril-hydrochlorothiazide (PRINZIDE,ZESTORETIC) 20-12.5 MG tablet Take 0.5 tablets by mouth daily.  45 tablet 1  . ranitidine (ZANTAC) 300 MG tablet Take 1 tablet (300 mg total) by mouth at bedtime. For heartburn, reflex 30 tablet 5   No current facility-administered medications for this visit.     Review of Systems Review of Systems  Constitutional: Negative.   Respiratory: Negative.   Cardiovascular: Negative.   Gastrointestinal: Negative.     Blood pressure 122/66, pulse 68, resp. rate 12, height 5\' 1"  (1.549 m), weight 112 lb (50.8 kg).  Physical Exam Physical Exam  Constitutional: She is oriented to person, place, and time. She appears well-developed and well-nourished.  Frail, elderly female who moves slowly  Eyes: Conjunctivae are normal. No scleral icterus.  Neck: Neck supple.  Cardiovascular: Normal rate, regular rhythm and normal heart sounds.   Pulmonary/Chest: Effort normal and breath sounds normal. Right breast exhibits no inverted nipple, no mass, no nipple discharge, no skin change and no tenderness. Left breast exhibits no inverted nipple, no mass, no nipple discharge, no skin change and no tenderness. Breasts are symmetrical.  Abdominal: Bowel sounds are normal. She exhibits no distension.  Lymphadenopathy:    She has no cervical adenopathy.    She has no axillary adenopathy.  Neurological: She is alert and oriented to person, place, and time.  Skin: Skin is warm and  dry.  Psychiatric: She has a normal mood and affect.    Data Reviewed Mammogram reviewed   Assessment    Hx of right breast cancer - stable exam today, mammogram is stable.  Hx of colon cancer in situ, cecal polyp, s/p right colectomy. Pt would be due for a surveillance colonoscopy now, but pt has declined. Understandably, her age and overall condition do not warrant colonoscopy unless symptomatic.  Plan Check CEA and CA 27-29 today.   Follow up with Dr. Sanda Fox in 1 year for bilateral screening mammogram. Call with questions and concerns.    HPI, Physical Exam, Assessment and Plan have been  scribed under the direction and in the presence of Pamela Jewel, MD.  Pamela Fox, CMA  I have completed the exam and reviewed the above documentation for accuracy and completeness.  I agree with the above.  Haematologist has been used and any errors in dictation or transcription are unintentional.  Pamela Fox, M.D., F.A.C.S.     Pamela Fox 05/24/2017, 11:18 AM

## 2017-05-24 NOTE — Patient Instructions (Signed)
Follow up with Dr. Sanda Klein for future mammograms and breast checks. Call with questions and concerns.

## 2017-05-25 ENCOUNTER — Telehealth: Payer: Self-pay | Admitting: *Deleted

## 2017-05-25 LAB — CEA: CEA: 3.2 ng/mL (ref 0.0–4.7)

## 2017-05-25 LAB — CANCER ANTIGEN 27.29: CA 27.29: 18.3 U/mL (ref 0.0–38.6)

## 2017-05-25 NOTE — Telephone Encounter (Signed)
-----   Message from Christene Lye, MD sent at 05/25/2017  7:15 AM EDT ----- Normal labs. Rosann Auerbach, please inform pt.

## 2017-05-25 NOTE — Telephone Encounter (Signed)
Notified patient as instructed, patient pleased. Discussed follow-up appointments, patient agrees  

## 2017-05-31 ENCOUNTER — Encounter: Payer: Self-pay | Admitting: Family Medicine

## 2017-05-31 ENCOUNTER — Ambulatory Visit (INDEPENDENT_AMBULATORY_CARE_PROVIDER_SITE_OTHER): Payer: Medicare Other | Admitting: Family Medicine

## 2017-05-31 VITALS — BP 118/78 | HR 79 | Temp 98.0°F | Resp 16 | Wt 114.4 lb

## 2017-05-31 DIAGNOSIS — E782 Mixed hyperlipidemia: Secondary | ICD-10-CM

## 2017-05-31 DIAGNOSIS — Z5181 Encounter for therapeutic drug level monitoring: Secondary | ICD-10-CM

## 2017-05-31 DIAGNOSIS — Z85038 Personal history of other malignant neoplasm of large intestine: Secondary | ICD-10-CM

## 2017-05-31 DIAGNOSIS — I10 Essential (primary) hypertension: Secondary | ICD-10-CM

## 2017-05-31 DIAGNOSIS — Z853 Personal history of malignant neoplasm of breast: Secondary | ICD-10-CM

## 2017-05-31 DIAGNOSIS — M40204 Unspecified kyphosis, thoracic region: Secondary | ICD-10-CM

## 2017-05-31 DIAGNOSIS — N951 Menopausal and female climacteric states: Secondary | ICD-10-CM | POA: Insufficient documentation

## 2017-05-31 DIAGNOSIS — E559 Vitamin D deficiency, unspecified: Secondary | ICD-10-CM

## 2017-05-31 DIAGNOSIS — Z13820 Encounter for screening for osteoporosis: Secondary | ICD-10-CM | POA: Diagnosis not present

## 2017-05-31 LAB — LIPID PANEL
CHOL/HDL RATIO: 2.8 ratio (ref <5.0)
CHOLESTEROL: 213 mg/dL — AB (ref <200)
HDL: 75 mg/dL (ref >50)
LDL CALC: 121 mg/dL — AB (ref <100)
Triglycerides: 87 mg/dL (ref <150)
VLDL: 17 mg/dL (ref <30)

## 2017-05-31 LAB — COMPLETE METABOLIC PANEL WITHOUT GFR
ALT: 11 U/L (ref 6–29)
AST: 16 U/L (ref 10–35)
Albumin: 4.2 g/dL (ref 3.6–5.1)
Alkaline Phosphatase: 62 U/L (ref 33–130)
BUN: 11 mg/dL (ref 7–25)
CO2: 25 mmol/L (ref 20–32)
Calcium: 9.9 mg/dL (ref 8.6–10.4)
Chloride: 102 mmol/L (ref 98–110)
Creat: 0.93 mg/dL — ABNORMAL HIGH (ref 0.60–0.88)
GFR, Est African American: 67 mL/min (ref >=60)
GFR, Est Non African American: 58 mL/min — ABNORMAL LOW (ref >=60)
Glucose, Bld: 82 mg/dL (ref 65–99)
Potassium: 4.4 mmol/L (ref 3.5–5.3)
Sodium: 140 mmol/L (ref 135–146)
Total Bilirubin: 0.5 mg/dL (ref 0.2–1.2)
Total Protein: 7 g/dL (ref 6.1–8.1)

## 2017-05-31 MED ORDER — LISINOPRIL 5 MG PO TABS: 5.0000 mg | ORAL_TABLET | Freq: Every day | ORAL | 3 refills | Status: DC

## 2017-05-31 NOTE — Assessment & Plan Note (Signed)
Order DEXA scan to evaluate for thin bones

## 2017-05-31 NOTE — Assessment & Plan Note (Signed)
Check level and supplement if needed 

## 2017-05-31 NOTE — Patient Instructions (Addendum)
We'll get labs today Stop the lisinopril-HCTZ combination pill Start plain 5 mg lisinopril daily Return in 2 weeks to see the CMA for just a blood pressure recheck Return in 6 months to see Dr. Sanda Klein

## 2017-05-31 NOTE — Progress Notes (Signed)
BP 118/78   Pulse 79   Temp 98 F (36.7 C) (Oral)   Resp 16   Wt 114 lb 6.4 oz (51.9 kg)   SpO2 96%   BMI 21.62 kg/m    Subjective:    Patient ID: Pamela Fox, female    DOB: Sep 30, 1934, 83 y.o.   MRN: [id number]  HPI: Pamela Fox is a 83 y.o. female  Chief Complaint  Patient presents with  . Follow-up    6 months    HPI  She passed out a few weeks ago; coming out of the bathroom, went down to the floor, out for a second; she has done that before, runs in the family; her heart is fine says her sister and caregiver; sister has this happen too; I asked about pacemakers or irregular heartbeats; mother and other sisters and two brothers had irregular heartbeat; she has a heart doctor, Dr. Clayborn Bigness and he knows about these episodes; he doesn't think they are worrisome; she has had these worked up; she drinks plenty of water  She has HTN; last Monday, her BP was 120 range; 841/32, previous systolic 440 ; eating better; less stress in the life  Sees Dr. Jamal Collin for her hx of breast cancer; he did an exam, nothing worrisome  High cholesterol; on atorvastatin and occasional aches but able to go and do; occasionally eats eggs; not many fatty meats  Some thoracic kyphosis; last DEXA several years ago; not taking calcium plus D  Dementia; hanging in there with the medicine; no risky behaviors, no wandering  Depression screen [facility] 2/9 05/31/2017 11/30/2016 05/27/2016 08/08/2015  Decreased Interest 0 0 0 0  Down, Depressed, Hopeless 0 0 0 0  PHQ - 2 Score 0 0 0 0    Relevant past medical, surgical, family and social history reviewed Past Medical History:  Diagnosis Date  . Breast cancer [facility]) 2007   Right, radiation treatment [facility]  . Colon cancer ([place]) 2009  . Colon polyps   . Hypertension 1990  . Personal history of radiation therapy    Past Surgical History:  Procedure Laterality Date  . ABDOMINAL HYSTERECTOMY    . BREAST BIOPSY  1997  . BREAST BIOPSY Right 2007     Stereo, positive  . BREAST SURGERY Right 2007   lumpectomy  . COLECTOMY  2009   colectomy for polyp wuth in-situ [place]  . COLONOSCOPY  [id number]   polyps  . salpingo oophorectmy     Family History  Problem Relation Age of Onset  . Cancer Paternal Aunt        breast  . Hypertension Sister   . Hyperlipidemia Sister   . Hyperlipidemia Brother   . Hypertension Brother   . Hyperlipidemia Brother   . Hypertension Brother   . Breast cancer Neg Hx    Social History   Social History  . Marital status: Single    Spouse name: N/A  . Number of children: N/A  . Years of education: N/A   Occupational History  . Not on file.   Social History Main Topics  . Smoking status: Never Smoker  . Smokeless tobacco: Never Used  . Alcohol use No  . Drug use: No  . Sexual activity: Not Currently   Other Topics Concern  . Not on file   Social History Narrative  . No narrative on file    Interim medical history since last visit reviewed. Allergies and medications reviewed  Review of Systems Per HPI unless  specifically indicated above     Objective:    BP 118/78   Pulse 79   Temp 98 F (36.7 C) (Oral)   Resp 16   Wt 114 lb 6.4 oz (51.9 kg)   SpO2 96%   BMI 21.62 kg/m   Wt Readings from Last 3 Encounters:  05/31/17 114 lb 6.4 oz (51.9 kg)  05/24/17 112 lb (50.8 kg)  11/30/16 113 lb (51.3 kg)    Physical Exam  Constitutional: She appears well-developed and well-nourished. No distress.  HENT:  Head: Normocephalic and atraumatic.  Eyes: EOM are normal. No scleral icterus.  Neck: No thyromegaly present.  Cardiovascular: Normal rate, regular rhythm and normal heart sounds.   No murmur heard. Pulmonary/Chest: Effort normal and breath sounds normal. No respiratory distress. She has no wheezes.  Abdominal: Soft. Bowel sounds are normal. She exhibits no distension.  Musculoskeletal: Normal range of motion. She exhibits no edema.  Neurological: She is alert. She exhibits normal  muscle tone.  Skin: Skin is warm and dry. She is not diaphoretic. No pallor.  Psychiatric: She has a normal mood and affect. Her behavior is normal. Judgment and thought content normal.      Assessment & Plan:   Problem List Items Addressed This Visit      Cardiovascular and Mediastinum   BP (high blood pressure)    Reduce BP medicine just a bit to see if that help, discussed goal systolic less than 854 mmHg      Relevant Medications   aspirin 81 MG chewable tablet   lisinopril (PRINIVIL,ZESTRIL) 5 MG tablet     Musculoskeletal and Integument   Thoracic kyphosis    Check DEXA scan; fall precautions, calcium and vit D      Relevant Orders   VITAMIN D 25 Hydroxy (Vit-D Deficiency, Fractures) (Completed)     Other   Vitamin D deficiency    Check level and supplement if needed      Relevant Orders   VITAMIN D 25 Hydroxy (Vit-D Deficiency, Fractures) (Completed)   Postmenopausal disorder    Order DEXA scan to evaluate for thin bones      Personal history of colon cancer    Managed by surgeon      Personal history of breast cancer    Managed by surgeon      Medication monitoring encounter    Check liver and kidneys      Relevant Orders   COMPLETE METABOLIC PANEL WITH GFR (Completed)   Hyperlipidemia - Primary    Check lipids today      Relevant Medications   aspirin 81 MG chewable tablet   lisinopril (PRINIVIL,ZESTRIL) 5 MG tablet   Other Relevant Orders   Lipid panel (Completed)    Other Visit Diagnoses    Screening for osteoporosis           Follow up plan: Return in about 6 months (around 12/01/2017) for follow-up visit with Dr. Sanda Klein.  An after-visit summary was printed and given to the patient at [facility].  Please see the patient instructions which may contain other information and recommendations beyond what is mentioned above in the assessment and plan.  Meds ordered this encounter  Medications  . aspirin 81 MG chewable tablet    Sig: Chew 81  mg by mouth 2 (two) times a week.  Marland Kitchen lisinopril (PRINIVIL,ZESTRIL) 5 MG tablet    Sig: Take 1 tablet (5 mg total) by mouth daily. For blood pressure and to protect kidneys  Dispense:  90 tablet    Refill:  3    CANCEL the lisinopril-hctz combination pill; this replaces that one    Orders Placed This Encounter  Procedures  . COMPLETE METABOLIC PANEL WITH GFR  . Lipid panel  . VITAMIN D 25 Hydroxy (Vit-D Deficiency, Fractures)

## 2017-05-31 NOTE — Assessment & Plan Note (Signed)
Check lipids today 

## 2017-05-31 NOTE — Assessment & Plan Note (Signed)
Managed by surgeon

## 2017-05-31 NOTE — Assessment & Plan Note (Signed)
Reduce BP medicine just a bit to see if that help, discussed goal systolic less than 164 mmHg

## 2017-05-31 NOTE — Assessment & Plan Note (Signed)
Check DEXA scan; fall precautions, calcium and vit D

## 2017-05-31 NOTE — Assessment & Plan Note (Signed)
Check liver and kidneys 

## 2017-06-01 LAB — VITAMIN D 25 HYDROXY (VIT D DEFICIENCY, FRACTURES): VIT D 25 HYDROXY: 22 ng/mL — AB (ref 30–100)

## 2017-07-27 ENCOUNTER — Telehealth: Payer: Self-pay | Admitting: Family Medicine

## 2017-08-09 DIAGNOSIS — Z23 Encounter for immunization: Secondary | ICD-10-CM | POA: Diagnosis not present

## 2017-08-26 ENCOUNTER — Encounter: Payer: Self-pay | Admitting: General Surgery

## 2017-09-17 DIAGNOSIS — H401133 Primary open-angle glaucoma, bilateral, severe stage: Secondary | ICD-10-CM | POA: Diagnosis not present

## 2017-09-20 ENCOUNTER — Telehealth: Payer: Self-pay

## 2017-09-20 NOTE — Telephone Encounter (Signed)
Received rx request for omeprazole 20mg  written by Dr.Morrisey in 03/02/2016, last filled 06/25/2016. Please advise. Pt currently taking Zantac

## 2017-09-20 NOTE — Telephone Encounter (Signed)
Omeprazole not recommended at her age, risk of osteoporosis I see that the DEXA scan order still has not been completed Please encourage her caregiver to get this scheduled and done I also noted on her medicine list that her aspirin was listed as "chewable"; I encourage them to get her the ENTERIC COATED kind, 81 mg daily instead  Thank you

## 2017-09-20 NOTE — Telephone Encounter (Signed)
Patient's sister nancy notified, will schedule dexa and change aspirin

## 2017-10-04 ENCOUNTER — Other Ambulatory Visit: Payer: Self-pay | Admitting: Family Medicine

## 2017-10-26 ENCOUNTER — Encounter: Payer: Self-pay | Admitting: Family Medicine

## 2017-10-26 ENCOUNTER — Ambulatory Visit
Admission: RE | Admit: 2017-10-26 | Discharge: 2017-10-26 | Disposition: A | Discharge status: Home / Self Care | Payer: Medicare Other | Source: Ambulatory Visit | Attending: Family Medicine | Admitting: Family Medicine

## 2017-10-26 DIAGNOSIS — N951 Menopausal and female climacteric states: Secondary | ICD-10-CM | POA: Insufficient documentation

## 2017-10-26 DIAGNOSIS — Z13820 Encounter for screening for osteoporosis: Secondary | ICD-10-CM

## 2017-10-26 DIAGNOSIS — M8580 Other specified disorders of bone density and structure, unspecified site: Secondary | ICD-10-CM | POA: Diagnosis not present

## 2017-10-26 DIAGNOSIS — Z780 Asymptomatic menopausal state: Secondary | ICD-10-CM | POA: Diagnosis not present

## 2017-10-26 DIAGNOSIS — M8588 Other specified disorders of bone density and structure, other site: Secondary | ICD-10-CM | POA: Diagnosis not present

## 2017-10-26 HISTORY — DX: Other specified disorders of bone density and structure, unspecified site: M85.80

## 2017-11-24 NOTE — Telephone Encounter (Signed)
Resolved

## 2017-12-01 ENCOUNTER — Ambulatory Visit: Payer: Medicare Other | Admitting: Family Medicine

## 2017-12-09 ENCOUNTER — Ambulatory Visit (INDEPENDENT_AMBULATORY_CARE_PROVIDER_SITE_OTHER): Payer: Medicare Other | Admitting: Family Medicine

## 2017-12-09 ENCOUNTER — Encounter: Payer: Self-pay | Admitting: Family Medicine

## 2017-12-09 DIAGNOSIS — Z5181 Encounter for therapeutic drug level monitoring: Secondary | ICD-10-CM

## 2017-12-09 DIAGNOSIS — I10 Essential (primary) hypertension: Secondary | ICD-10-CM

## 2017-12-09 DIAGNOSIS — F0390 Unspecified dementia without behavioral disturbance: Secondary | ICD-10-CM

## 2017-12-09 DIAGNOSIS — E559 Vitamin D deficiency, unspecified: Secondary | ICD-10-CM

## 2017-12-09 DIAGNOSIS — Z66 Do not resuscitate: Secondary | ICD-10-CM | POA: Diagnosis not present

## 2017-12-09 DIAGNOSIS — E782 Mixed hyperlipidemia: Secondary | ICD-10-CM

## 2017-12-09 NOTE — Assessment & Plan Note (Signed)
Check lipids today; continue med

## 2017-12-09 NOTE — Assessment & Plan Note (Signed)
Discussed with patient and sister [name], she wishes to be DNR, family member agrees; doctor respectful of patient's wishes and stop sign approved

## 2017-12-09 NOTE — Assessment & Plan Note (Signed)
Fair control; limit salt

## 2017-12-09 NOTE — Progress Notes (Signed)
BP (!) 142/74   Pulse 96   Temp 98.2 F (36.8 C) (Oral)   Resp 14   Wt 114 lb 3.2 oz (51.8 kg)   SpO2 97%   BMI 21.58 kg/m    Subjective:    Patient ID: Pamela Fox, female    DOB: 06/13/1934, 84 y.o.   MRN: [id number]  HPI: Pamela Fox is a 84 y.o. female  Chief Complaint  Patient presents with  . Follow-up    also has sore throat    HPI  She has a little sore throat; happens every year; gets up every few mornings c/o sore throat; has sinus issues, drainging; was blowing out yucky stuff at first, clear now; no fevers; occasional cough; no problems with ears  Low vitamin D; she is taking some vitamin D, 2000 iu daily from Aug from December, then 1000 in January  Last lipids reviewed  CA27.29 normal in August  Weight is stable over last 6 months; had bathroom redone, senior friendly and sit down to shower  Dementia got worse when staying with family, better now at home, progressing; offered neruro referral  [name], sister of patient, HCPOA, caregiver; agrees with DNR   Depression screen [facility] 2/9 12/09/2017 05/31/2017 11/30/2016 05/27/2016 08/08/2015  Decreased Interest 0 0 0 0 0  Down, Depressed, Hopeless 0 0 0 0 0  PHQ - 2 Score 0 0 0 0 0    Relevant past medical, surgical, family and social history reviewed Past Medical History:  Diagnosis Date  . Breast cancer [facility]) 2007   Right, radiation treatment [facility]  . Colon cancer ([place]) 2009  . Colon polyps   . Hypertension 1990  . Osteopenia 10/26/2017   Next DEXA due Jan 2021  . Personal history of radiation therapy    Past Surgical History:  Procedure Laterality Date  . ABDOMINAL HYSTERECTOMY    . BREAST BIOPSY  1997  . BREAST BIOPSY Right 2007   Stereo, positive  . BREAST SURGERY Right 2007   lumpectomy  . COLECTOMY  2009   colectomy for polyp wuth in-situ CA  . COLONOSCOPY  [id number]   polyps  . salpingo oophorectmy     Family History  Problem Relation Age of Onset  . Cancer Paternal  Aunt        breast  . Hypertension Sister   . Hyperlipidemia Sister   . Hyperlipidemia Brother   . Hypertension Brother   . Hyperlipidemia Brother   . Hypertension Brother   . Breast cancer Neg Hx    Social History   Tobacco Use  . Smoking status: Never Smoker  . Smokeless tobacco: Never Used  Substance Use Topics  . Alcohol use: No    Alcohol/week: 0.0 oz  . Drug use: No    Interim medical history since last visit reviewed. Allergies and medications reviewed  Review of Systems Per HPI unless specifically indicated above     Objective:    BP (!) 142/74   Pulse 96   Temp 98.2 F (36.8 C) (Oral)   Resp 14   Wt 114 lb 3.2 oz (51.8 kg)   SpO2 97%   BMI 21.58 kg/m   Wt Readings from Last 3 Encounters:  12/09/17 114 lb 3.2 oz (51.8 kg)  05/31/17 114 lb 6.4 oz (51.9 kg)  05/24/17 112 lb (50.8 kg)    Physical Exam  Constitutional: She appears well-developed and well-nourished. No distress.  HENT:  Head: Normocephalic and atraumatic.  Eyes: EOM  are normal. No scleral icterus.  Neck: No thyromegaly present.  Cardiovascular: Normal rate, regular rhythm and normal heart sounds.  No murmur heard. Pulmonary/Chest: Effort normal and breath sounds normal. No respiratory distress. She has no wheezes.  Abdominal: Soft. Bowel sounds are normal. She exhibits no distension.  Musculoskeletal: Normal range of motion. She exhibits no edema.  Neurological: She is alert. She exhibits normal muscle tone.  Skin: Skin is warm and dry. She is not diaphoretic. No pallor.  Psychiatric: She has a normal mood and affect. Her behavior is normal. Judgment and thought content normal.       Assessment & Plan:   Problem List Items Addressed This Visit      Cardiovascular and Mediastinum   BP (high blood pressure)    Fair control; limit salt        Nervous and Auditory   Dementia    Check B12 and TSH, continue medicine      Relevant Orders   B12 (Completed)     Other   Vitamin  D deficiency    Check vit D today, supplement as indicated      Relevant Orders   VITAMIN D 25 Hydroxy (Vit-D Deficiency, Fractures) (Completed)   Medication monitoring encounter    Check liver and kidneys      Relevant Orders   CBC with Differential/Platelet (Completed)   COMPLETE METABOLIC PANEL WITH GFR (Completed)   Hyperlipidemia    Check lipids today; continue med      Relevant Orders   Lipid panel (Completed)   Do not resuscitate status    Discussed with patient and sister [name], she wishes to be DNR, family member agrees; doctor respectful of patient's wishes and stop sign approved      Relevant Orders   DNR (Do Not Resuscitate)       Follow up plan: Return in about 6 months (around 06/08/2018) for follow-up visit with Dr. Sanda Klein.  An after-visit summary was printed and given to the patient at [place].  Please see the patient instructions which may contain other information and recommendations beyond what is mentioned above in the assessment and plan.  No orders of the defined types were placed in this encounter.   Orders Placed This Encounter  Procedures  . VITAMIN D 25 Hydroxy (Vit-D Deficiency, Fractures)  . Lipid panel  . CBC with Differential/Platelet  . COMPLETE METABOLIC PANEL WITH GFR  . B12  . DNR (Do Not Resuscitate)

## 2017-12-09 NOTE — Patient Instructions (Addendum)
Try vitamin C (orange juice if not diabetic or vitamin C tablets) and drink green tea to help your immune system during your illness Get plenty of rest and hydration Call if any worse  Fall Prevention in the Home Falls can cause injuries. They can happen to people of all ages. There are many things you can do to make your home safe and to help prevent falls. What can I do on the outside of my home?  Regularly fix the edges of walkways and driveways and fix any cracks.  Remove anything that might make you trip as you walk through a door, such as a raised step or threshold.  Trim any bushes or trees on the path to your home.  Use bright outdoor lighting.  Clear any walking paths of anything that might make someone trip, such as rocks or tools.  Regularly check to see if handrails are loose or broken. Make sure that both sides of any steps have handrails.  Any raised decks and porches should have guardrails on the edges.  Have any leaves, snow, or ice cleared regularly.  Use sand or salt on walking paths during winter.  Clean up any spills in your garage right away. This includes oil or grease spills. What can I do in the bathroom?  Use night lights.  Install grab bars by the toilet and in the tub and shower. Do not use towel bars as grab bars.  Use non-skid mats or decals in the tub or shower.  If you need to sit down in the shower, use a plastic, non-slip stool.  Keep the floor dry. Clean up any water that spills on the floor as soon as it happens.  Remove soap buildup in the tub or shower regularly.  Attach bath mats securely with double-sided non-slip rug tape.  Do not have throw rugs and other things on the floor that can make you trip. What can I do in the bedroom?  Use night lights.  Make sure that you have a light by your bed that is easy to reach.  Do not use any sheets or blankets that are too big for your bed. They should not hang down onto the floor.  Have  a firm chair that has side arms. You can use this for support while you get dressed.  Do not have throw rugs and other things on the floor that can make you trip. What can I do in the kitchen?  Clean up any spills right away.  Avoid walking on wet floors.  Keep items that you use a lot in easy-to-reach places.  If you need to reach something above you, use a strong step stool that has a grab bar.  Keep electrical cords out of the way.  Do not use floor polish or wax that makes floors slippery. If you must use wax, use non-skid floor wax.  Do not have throw rugs and other things on the floor that can make you trip. What can I do with my stairs?  Do not leave any items on the stairs.  Make sure that there are handrails on both sides of the stairs and use them. Fix handrails that are broken or loose. Make sure that handrails are as long as the stairways.  Check any carpeting to make sure that it is firmly attached to the stairs. Fix any carpet that is loose or worn.  Avoid having throw rugs at the top or bottom of the stairs. If you do  have throw rugs, attach them to the floor with carpet tape.  Make sure that you have a light switch at the top of the stairs and the bottom of the stairs. If you do not have them, ask someone to add them for you. What else can I do to help prevent falls?  Wear shoes that: ? Do not have high heels. ? Have rubber bottoms. ? Are comfortable and fit you well. ? Are closed at the toe. Do not wear sandals.  If you use a stepladder: ? Make sure that it is fully opened. Do not climb a closed stepladder. ? Make sure that both sides of the stepladder are locked into place. ? Ask someone to hold it for you, if possible.  Clearly mark and make sure that you can see: ? Any grab bars or handrails. ? First and last steps. ? Where the edge of each step is.  Use tools that help you move around (mobility aids) if they are needed. These  include: ? Canes. ? Walkers. ? Scooters. ? Crutches.  Turn on the lights when you go into a dark area. Replace any light bulbs as soon as they burn out.  Set up your furniture so you have a clear path. Avoid moving your furniture around.  If any of your floors are uneven, fix them.  If there are any pets around you, be aware of where they are.  Review your medicines with your doctor. Some medicines can make you feel dizzy. This can increase your chance of falling. Ask your doctor what other things that you can do to help prevent falls. This information is not intended to replace advice given to you by your health care provider. Make sure you discuss any questions you have with your health care provider. Document Released: 08/01/2009 Document Revised: 03/12/2016 Document Reviewed: 11/09/2014 Elsevier Interactive Patient Education  [employer].

## 2017-12-09 NOTE — Assessment & Plan Note (Signed)
Check liver and kidneys 

## 2017-12-09 NOTE — Assessment & Plan Note (Signed)
Check vit D today, supplement as indicated

## 2017-12-09 NOTE — Assessment & Plan Note (Signed)
Check B12 and TSH, continue medicine

## 2017-12-10 LAB — CBC WITH DIFFERENTIAL/PLATELET
BASOS ABS: 12 {cells}/uL (ref 0–200)
Basophils Relative: 0.2 %
EOS ABS: 62 {cells}/uL (ref 15–500)
Eosinophils Relative: 1 %
HCT: 36.6 % (ref 35.0–45.0)
HEMOGLOBIN: 12 g/dL (ref 11.7–15.5)
Lymphs Abs: 2077 cells/uL (ref 850–3900)
MCH: 26.3 pg — AB (ref 27.0–33.0)
MCHC: 32.8 g/dL (ref 32.0–36.0)
MCV: 80.3 fL (ref 80.0–100.0)
MONOS PCT: 8.7 %
MPV: 10.7 fL (ref 7.5–12.5)
NEUTROS PCT: 56.6 %
Neutro Abs: 3509 cells/uL (ref 1500–7800)
Platelets: 202 10*3/uL (ref 140–400)
RBC: 4.56 10*6/uL (ref 3.80–5.10)
RDW: 15.3 % — AB (ref 11.0–15.0)
Total Lymphocyte: 33.5 %
WBC mixed population: 539 cells/uL (ref 200–950)
WBC: 6.2 10*3/uL (ref 3.8–10.8)

## 2017-12-10 LAB — COMPLETE METABOLIC PANEL WITH GFR
AG Ratio: 1.5 (calc) (ref 1.0–2.5)
ALBUMIN MSPROF: 3.9 g/dL (ref 3.6–5.1)
ALT: 7 U/L (ref 6–29)
AST: 14 U/L (ref 10–35)
Alkaline phosphatase (APISO): 57 U/L (ref 33–130)
BILIRUBIN TOTAL: 0.4 mg/dL (ref 0.2–1.2)
BUN: 12 mg/dL (ref 7–25)
CHLORIDE: 106 mmol/L (ref 98–110)
CO2: 26 mmol/L (ref 20–32)
Calcium: 9.2 mg/dL (ref 8.6–10.4)
Creat: 0.87 mg/dL (ref 0.60–0.88)
GFR, Est African American: 72 mL/min/{1.73_m2} (ref >=60)
GFR, Est Non African American: 62 mL/min/{1.73_m2} (ref >=60)
GLOBULIN: 2.6 g/dL (ref 1.9–3.7)
GLUCOSE: 79 mg/dL (ref 65–139)
Potassium: 3.9 mmol/L (ref 3.5–5.3)
SODIUM: 142 mmol/L (ref 135–146)
Total Protein: 6.5 g/dL (ref 6.1–8.1)

## 2017-12-10 LAB — LIPID PANEL
CHOL/HDL RATIO: 3 (calc) (ref <5.0)
CHOLESTEROL: 196 mg/dL (ref <200)
HDL: 65 mg/dL (ref >50)
LDL CHOLESTEROL (CALC): 105 mg/dL — AB
Non-HDL Cholesterol (Calc): 131 mg/dL (calc) — ABNORMAL HIGH (ref <130)
TRIGLYCERIDES: 150 mg/dL — AB (ref <150)

## 2017-12-10 LAB — VITAMIN D 25 HYDROXY (VIT D DEFICIENCY, FRACTURES): Vit D, 25-Hydroxy: 36 ng/mL (ref 30–100)

## 2017-12-10 LAB — VITAMIN B12: Vitamin B-12: 321 pg/mL (ref 200–1100)

## 2017-12-22 ENCOUNTER — Telehealth: Payer: Self-pay

## 2017-12-22 NOTE — Telephone Encounter (Signed)
Called pt no answer. LM for pt informing her of lab results, advised of the need to call back for questions or concerns.  CRM created. Labs routed to [facility].

## 2017-12-22 NOTE — Telephone Encounter (Signed)
-----   Message from Arnetha Courser, MD sent at 12/17/2017  5:31 PM EST ----- Please let the patient know that her vitamin D is in the normal range; please take 1,000 iu of vitamin D3 once a day to keep that normal Her vitamin B12 is low, so please start 500 or 1000 mcg of vitamin B12 once a day Her cholesterol is better than the last time (LDL dropped from 121 to 105); keep trying to limit saturated fats and get whole grains like cheerios and oatmeal; thank you

## 2017-12-23 ENCOUNTER — Telehealth: Payer: Self-pay | Admitting: Family Medicine

## 2017-12-23 NOTE — Telephone Encounter (Signed)
Pt/caregiver notified

## 2018-01-12 ENCOUNTER — Telehealth: Payer: Self-pay

## 2018-01-12 NOTE — Telephone Encounter (Signed)
Copied from [place]. Topic: Inquiry >> Jan 12, 2018  3:07 PM Margot Ables wrote: Reason for CRM: Pt sister asking if we have DNR available yet. Please call to advise.   Has anyone made you aware of the request above, please advise. Thanks

## 2018-01-12 NOTE — Telephone Encounter (Signed)
Please ask Marnette Burgess if she has the yellow STOP SIGN forms or if she can get them soon from somewhere (upstairs, hospital, etc.) Thank you

## 2018-01-18 NOTE — Telephone Encounter (Signed)
Any update on this yet? Thanks

## 2018-01-18 NOTE — Telephone Encounter (Signed)
Forms will be in at the end of the week per [facility]

## 2018-01-19 NOTE — Telephone Encounter (Signed)
Sure! I will have form mailed when it becomes available.

## 2018-01-19 NOTE — Telephone Encounter (Signed)
Called pt's sister (POA) no answer. LM informing her that DNR forms will be in at the end of the week.

## 2018-01-19 NOTE — Telephone Encounter (Signed)
Pamela Fox(Caregiver) is asking if the forms can be mailed to Pamela Fox(pt)  address.

## 2018-05-22 ENCOUNTER — Other Ambulatory Visit: Payer: Self-pay | Admitting: Family Medicine

## 2018-05-23 NOTE — Telephone Encounter (Signed)
Last Cr and K+ reviewed; Rx approved 

## 2018-05-27 DIAGNOSIS — H401133 Primary open-angle glaucoma, bilateral, severe stage: Secondary | ICD-10-CM | POA: Diagnosis not present

## 2018-06-03 ENCOUNTER — Other Ambulatory Visit: Payer: Self-pay | Admitting: Family Medicine

## 2018-06-05 NOTE — Telephone Encounter (Signed)
appt on 8/22 Last Cr and K+ reviewed Rx approved

## 2018-06-09 ENCOUNTER — Encounter: Payer: Self-pay | Admitting: Family Medicine

## 2018-06-09 ENCOUNTER — Ambulatory Visit (INDEPENDENT_AMBULATORY_CARE_PROVIDER_SITE_OTHER): Payer: Medicare Other | Admitting: Family Medicine

## 2018-06-09 VITALS — BP 110/78 | HR 81 | Temp 98.6°F | Resp 12 | Ht 61.0 in | Wt 112.3 lb

## 2018-06-09 DIAGNOSIS — I10 Essential (primary) hypertension: Secondary | ICD-10-CM

## 2018-06-09 DIAGNOSIS — D485 Neoplasm of uncertain behavior of skin: Secondary | ICD-10-CM | POA: Diagnosis not present

## 2018-06-09 DIAGNOSIS — M8580 Other specified disorders of bone density and structure, unspecified site: Secondary | ICD-10-CM

## 2018-06-09 DIAGNOSIS — E559 Vitamin D deficiency, unspecified: Secondary | ICD-10-CM | POA: Diagnosis not present

## 2018-06-09 DIAGNOSIS — E782 Mixed hyperlipidemia: Secondary | ICD-10-CM | POA: Diagnosis not present

## 2018-06-09 DIAGNOSIS — E538 Deficiency of other specified B group vitamins: Secondary | ICD-10-CM | POA: Insufficient documentation

## 2018-06-09 DIAGNOSIS — F0390 Unspecified dementia without behavioral disturbance: Secondary | ICD-10-CM

## 2018-06-09 NOTE — Assessment & Plan Note (Signed)
Able to manage for now; continue aricept

## 2018-06-09 NOTE — Assessment & Plan Note (Signed)
Under great control; limit salt in diet

## 2018-06-09 NOTE — Progress Notes (Signed)
BP 110/78   Pulse 81   Temp 98.6 F (37 C) (Oral)   Resp 12   Ht 5\' 1"  (1.549 m)   Wt 112 lb 4.8 oz (50.9 kg)   SpO2 98%   BMI 21.22 kg/m    Subjective:    Patient ID: Pamela Fox, female    DOB: [birth year], 84 y.o.   MRN: [id number]  HPI: Pamela Fox is a 84 y.o. female  Chief Complaint  Patient presents with  . Follow-up  . Allergic Reaction    having problems with dairy products, giving her diarrhea    HPI Patient presents with sister- [name] who is caregiver.  Has been noticing diarrhea after eating ice cream. Stopped ice cream and switched to popsciles and diarrhea stopped.   Rash noted to left foot and shin noted about 3 weeks ago started as red blisters and was itchy, not painful, has dried up and scabbed over now. Started as blisters and sister thought it looked like shingles; completed scabbed over now; putting vit E on it topically  Skin lesion to left face that patient picks at, has gotten bigger and seems to be more aggravating  Heartburn: sister states doesn't have any symptoms was on ranitidine; hasn't been on it for months and has no symptoms  Hypertension: takes lisinopril 5mg  every day; denies lightheadedness, dizziness, chest pain, blurry vision BP Readings from Last 3 Encounters:  06/09/18 110/78  12/09/17 (!) 142/74  05/31/17 118/78   Hyperlipidemia: atorvastatin 40mg  at bedtime. No side effects- myalgias.   Vitamin B12 deficiency; taking supplement  Hx of vitamin D; previously 22 and last level 36 on the calcium plus D  Lab Results  Component Value Date   CHOL 196 12/09/2017   HDL 65 12/09/2017   LDLCALC 105 (H) 12/09/2017   TRIG 150 (H) 12/09/2017   CHOLHDL 3.0 12/09/2017   Dementia: aricept 10mg  a day. Notes slow progression of memory loss- can recall breakfast, sister in room. No aggression or unsafe behaviors like wandering noted.    Osteopenia: takes calcium and vitamin D supplement; no falls in the last year. Has walker  and sit down shower.   Urine question; asked if okay to give her cranberry pills or juice; no odor or burning or frequency  Depression screen [facility] 2/9 06/09/2018 12/09/2017 05/31/2017 11/30/2016 05/27/2016  Decreased Interest 0 0 0 0 0  Down, Depressed, Hopeless 0 0 0 0 0  PHQ - 2 Score 0 0 0 0 0   Relevant past medical, surgical, family and social history reviewed Past Medical History:  Diagnosis Date  . Breast cancer [facility]) 2007   Right, radiation treatment [facility]  . Colon cancer ([place]) 2009  . Colon polyps   . Hypertension 1990  . Osteopenia 10/26/2017   Next DEXA due Jan 2021  . Personal history of radiation therapy    Past Surgical History:  Procedure Laterality Date  . ABDOMINAL HYSTERECTOMY    . BREAST BIOPSY  1997  . BREAST BIOPSY Right 2007   Stereo, positive  . BREAST SURGERY Right 2007   lumpectomy  . COLECTOMY  2009   colectomy for polyp wuth in-situ CA  . COLONOSCOPY  [id number]   polyps  . salpingo oophorectmy     Family History  Problem Relation Age of Onset  . Cancer Paternal Aunt        breast  . Hypertension Sister   . Hyperlipidemia Sister   . Hyperlipidemia Brother   . Hypertension  Brother   . Hyperlipidemia Brother   . Hypertension Brother   . Breast cancer Neg Hx    Social History   Tobacco Use  . Smoking status: Never Smoker  . Smokeless tobacco: Never Used  Substance Use Topics  . Alcohol use: No    Alcohol/week: 0.0 standard drinks  . Drug use: No    Interim medical history since last visit reviewed. Allergies and medications reviewed  Review of Systems  Constitutional: Positive for appetite change (smaller appetite). Negative for activity change and fatigue.  HENT: Positive for sinus pressure. Negative for congestion, postnasal drip, sinus pain and sore throat.   Eyes: Negative for visual disturbance.  Respiratory: Negative for cough and shortness of breath.   Cardiovascular: Negative for chest pain and palpitations.  Gastrointestinal:  Negative for abdominal pain, blood in stool, constipation, diarrhea and nausea.  Genitourinary: Positive for frequency. Negative for dysuria, hematuria and urgency.  Musculoskeletal: Positive for arthralgias (right knee pain) and myalgias. Negative for back pain and neck pain.  Skin: Positive for rash (noted rash to left leg that has dried up, some itching, not painful).  Neurological: Negative for dizziness, numbness and headaches.  Psychiatric/Behavioral: Negative for agitation and sleep disturbance. The patient is not nervous/anxious.    Per HPI unless specifically indicated above     Objective:    BP 110/78   Pulse 81   Temp 98.6 F (37 C) (Oral)   Resp 12   Ht 5\' 1"  (1.549 m)   Wt 112 lb 4.8 oz (50.9 kg)   SpO2 98%   BMI 21.22 kg/m   Wt Readings from Last 3 Encounters:  06/09/18 112 lb 4.8 oz (50.9 kg)  12/09/17 114 lb 3.2 oz (51.8 kg)  05/31/17 114 lb 6.4 oz (51.9 kg)    Physical Exam  Constitutional: She appears well-developed and well-nourished. No distress.  HENT:  Head: Normocephalic and atraumatic.  Eyes: EOM are normal. No scleral icterus.  Neck: No thyromegaly present.  Cardiovascular: Normal rate, regular rhythm and normal heart sounds.  No murmur heard. Pulmonary/Chest: Effort normal and breath sounds normal. No respiratory distress. She has no wheezes.  Abdominal: Soft. Bowel sounds are normal. She exhibits no distension.  Musculoskeletal: She exhibits no edema.  Neurological: She is alert.  Skin: Skin is warm and dry. She is not diaphoretic. No pallor.  Dark brown keratotic lesions on the lateral cheeks; larger darkly pigmented lesion on the LEFT cheek, keratotic appearance with irregular surface; scabs on the dorsum of the LEFT foot and distal LEFT shin  Psychiatric: She has a normal mood and affect. She exhibits abnormal recent memory.  Poor historian; pleasant, quiet    Results for orders placed or performed in visit on 12/09/17  VITAMIN D 25 Hydroxy  (Vit-D Deficiency, Fractures)  Result Value Ref Range   Vit D, 25-Hydroxy 36 30 - 100 ng/mL  Lipid panel  Result Value Ref Range   Cholesterol 196 <200 mg/dL   HDL 65 >50 mg/dL   Triglycerides 150 (H) <150 mg/dL   LDL Cholesterol (Calc) 105 (H) mg/dL (calc)   Total CHOL/HDL Ratio 3.0 <5.0 (calc)   Non-HDL Cholesterol (Calc) 131 (H) <130 mg/dL (calc)  CBC with Differential/Platelet  Result Value Ref Range   WBC 6.2 3.8 - 10.8 Thousand/uL   RBC 4.56 3.80 - 5.10 Million/uL   Hemoglobin 12.0 11.7 - 15.5 g/dL   HCT 36.6 35.0 - 45.0 %   MCV 80.3 80.0 - 100.0 fL   MCH 26.3 (  L) 27.0 - 33.0 pg   MCHC 32.8 32.0 - 36.0 g/dL   RDW 15.3 (H) 11.0 - 15.0 %   Platelets 202 140 - 400 Thousand/uL   MPV 10.7 7.5 - 12.5 fL   Neutro Abs 3,509 1,500 - 7,800 cells/uL   Lymphs Abs 2,077 850 - 3,900 cells/uL   WBC mixed population 539 200 - 950 cells/uL   Eosinophils Absolute 62 15 - 500 cells/uL   Basophils Absolute 12 0 - 200 cells/uL   Neutrophils Relative % 56.6 %   Total Lymphocyte 33.5 %   Monocytes Relative 8.7 %   Eosinophils Relative 1.0 %   Basophils Relative 0.2 %  COMPLETE METABOLIC PANEL WITH GFR  Result Value Ref Range   Glucose, Bld 79 65 - 139 mg/dL   BUN 12 7 - 25 mg/dL   Creat 0.87 0.60 - 0.88 mg/dL   GFR, Est Non African American 62 > OR = 60 mL/min/1.15m2   GFR, Est African American 72 > OR = 60 mL/min/1.72m2   BUN/Creatinine Ratio NOT APPLICABLE 6 - 22 (calc)   Sodium 142 135 - 146 mmol/L   Potassium 3.9 3.5 - 5.3 mmol/L   Chloride 106 98 - 110 mmol/L   CO2 26 20 - 32 mmol/L   Calcium 9.2 8.6 - 10.4 mg/dL   Total Protein 6.5 6.1 - 8.1 g/dL   Albumin 3.9 3.6 - 5.1 g/dL   Globulin 2.6 1.9 - 3.7 g/dL (calc)   AG Ratio 1.5 1.0 - 2.5 (calc)   Total Bilirubin 0.4 0.2 - 1.2 mg/dL   Alkaline phosphatase (APISO) 57 33 - 130 U/L   AST 14 10 - 35 U/L   ALT 7 6 - 29 U/L  B12  Result Value Ref Range   Vitamin B-12 321 200 - 1,100 pg/mL      Assessment & Plan:   Problem  List Items Addressed This Visit      Cardiovascular and Mediastinum   BP (high blood pressure)    Under great control; limit salt in diet        Nervous and Auditory   Dementia    Able to manage for now; continue aricept        Musculoskeletal and Integument   Osteopenia    Next dexa due Jan 2021        Other   Vitamin D deficiency    Continue supplement      Vitamin B12 deficiency    Continue supplement      Hyperlipidemia    Continue course       Other Visit Diagnoses    Neoplasm of uncertain behavior of skin of face    -  Primary   Relevant Orders   Ambulatory referral to Dermatology       Follow up plan: Return in about 6 months (around 12/10/2018) for follow-up visit with Dr. Sanda Klein.  An after-visit summary was printed and given to the patient at [place].  Please see the patient instructions which may contain other information and recommendations beyond what is mentioned above in the assessment and plan.  No orders of the defined types were placed in this encounter.   Orders Placed This Encounter  Procedures  . Ambulatory referral to Dermatology

## 2018-06-09 NOTE — Assessment & Plan Note (Signed)
Continue course

## 2018-06-09 NOTE — Assessment & Plan Note (Signed)
Continue supplement. 

## 2018-06-09 NOTE — Assessment & Plan Note (Signed)
Next dexa due Jan 2021

## 2018-06-09 NOTE — Patient Instructions (Signed)
We'll have her see the skin doctor We'll get labs next time Okay for cranberry pills daily

## 2018-06-19 ENCOUNTER — Other Ambulatory Visit: Payer: Self-pay | Admitting: Family Medicine

## 2018-06-21 NOTE — Telephone Encounter (Signed)
Last OV: 06/09/18  Next: 12/12/18

## 2018-06-23 DIAGNOSIS — L821 Other seborrheic keratosis: Secondary | ICD-10-CM | POA: Diagnosis not present

## 2018-09-24 ENCOUNTER — Other Ambulatory Visit: Payer: Self-pay | Admitting: Nurse Practitioner

## 2018-11-25 DIAGNOSIS — H401133 Primary open-angle glaucoma, bilateral, severe stage: Secondary | ICD-10-CM | POA: Diagnosis not present

## 2018-12-12 ENCOUNTER — Ambulatory Visit (INDEPENDENT_AMBULATORY_CARE_PROVIDER_SITE_OTHER): Payer: Medicare Other | Admitting: Family Medicine

## 2018-12-12 ENCOUNTER — Encounter: Payer: Self-pay | Admitting: Family Medicine

## 2018-12-12 VITALS — BP 116/78 | HR 72 | Temp 98.5°F | Resp 12 | Ht 61.0 in | Wt 111.0 lb

## 2018-12-12 DIAGNOSIS — E538 Deficiency of other specified B group vitamins: Secondary | ICD-10-CM

## 2018-12-12 DIAGNOSIS — Z5181 Encounter for therapeutic drug level monitoring: Secondary | ICD-10-CM

## 2018-12-12 DIAGNOSIS — M8580 Other specified disorders of bone density and structure, unspecified site: Secondary | ICD-10-CM

## 2018-12-12 DIAGNOSIS — Z66 Do not resuscitate: Secondary | ICD-10-CM

## 2018-12-12 DIAGNOSIS — M179 Osteoarthritis of knee, unspecified: Secondary | ICD-10-CM | POA: Insufficient documentation

## 2018-12-12 DIAGNOSIS — M1710 Unilateral primary osteoarthritis, unspecified knee: Secondary | ICD-10-CM | POA: Insufficient documentation

## 2018-12-12 DIAGNOSIS — M1711 Unilateral primary osteoarthritis, right knee: Secondary | ICD-10-CM | POA: Diagnosis not present

## 2018-12-12 DIAGNOSIS — F0390 Unspecified dementia without behavioral disturbance: Secondary | ICD-10-CM | POA: Diagnosis not present

## 2018-12-12 DIAGNOSIS — I10 Essential (primary) hypertension: Secondary | ICD-10-CM | POA: Diagnosis not present

## 2018-12-12 DIAGNOSIS — E782 Mixed hyperlipidemia: Secondary | ICD-10-CM | POA: Diagnosis not present

## 2018-12-12 DIAGNOSIS — H6122 Impacted cerumen, left ear: Secondary | ICD-10-CM | POA: Diagnosis not present

## 2018-12-12 MED ORDER — LISINOPRIL 5 MG PO TABS: 5.0000 mg | ORAL_TABLET | Freq: Every day | ORAL | 0 refills | Status: DC

## 2018-12-12 NOTE — Progress Notes (Signed)
Pamela Fox, please let the patient know that her CBC is normal; other labs pending at the time of my note

## 2018-12-12 NOTE — Assessment & Plan Note (Signed)
Next DEXA Jan 2021; fall precautions

## 2018-12-12 NOTE — Assessment & Plan Note (Signed)
Try turmeric and tylenol and ice, protection between ice and skin; she politely declined seeing ortho or having xrays but she is welcome to call for either if changes mind

## 2018-12-12 NOTE — Assessment & Plan Note (Signed)
Offered neurologist appointment; keep things as they are she wishes; no wandering behavior, no behavioral disturbances

## 2018-12-12 NOTE — Progress Notes (Signed)
BP 116/78   Pulse 72   Temp 98.5 F (36.9 C) (Oral)   Resp 12   Ht 5\' 1"  (1.549 m)   Wt 111 lb (50.3 kg)   SpO2 95%   BMI 20.97 kg/m    Subjective:    Patient ID: Pamela Fox, female    DOB: [birth year], 85 y.o.   MRN: [id number]  HPI: Pamela Fox is a 85 y.o. female  Chief Complaint  Patient presents with  . Follow-up  . Knee Pain    right with sweeling  . Medication Refill    HPI Patient is here for follow-up Last visit was June 09, 2018  She has knee pain; she has arthritis, right knee more than anything; has had some swelling in it; she walks and gets up and walks all day; just sits down to eat; using topical with menthyl salicylate and eucalyptus; they will consider turmeric; does not use tylenol  Hypertension; on low dose ACE-I; checking BP at home; about what it is here; might go up in the mornings at breakfast; no coffee  Lab Results  Component Value Date   CREATININE 0.87 12/09/2017   Lab Results  Component Value Date   K 3.9 12/09/2017   Hyperlipidemia; taking statin; does [place] bacon; does eat eggs, no more than 3 a week  She also needs refills of her medicine  Vitamin B12 deficiency; taking supplemenht  Vitamin D deficiency; taking supplement  Dementia; memory is declining more; knows sister but does not recognize who she is, but recognizes as a caretaker  Osteopenia; practicing good fall precautions; some greens  Sister asks if I can check her ears; she gets wax at times  Depression screen [facility] 2/9 12/12/2018 06/09/2018 12/09/2017 05/31/2017 11/30/2016  Decreased Interest 0 0 0 0 0  Down, Depressed, Hopeless 0 0 0 0 0  PHQ - 2 Score 0 0 0 0 0  Altered sleeping 0 - - - -  Tired, decreased energy 0 - - - -  Change in appetite 0 - - - -  Feeling bad or failure about yourself  0 - - - -  Trouble concentrating 0 - - - -  Moving slowly or fidgety/restless 0 - - - -  Suicidal thoughts 0 - - - -  PHQ-9 Score 0 - - - -  Difficult doing  work/chores Not difficult at all - - - -   Fall Risk  12/12/2018 06/09/2018 12/09/2017 05/31/2017 11/30/2016  Falls in the past year? 0 No No No No  Number falls in past yr: 0 - - - -  Injury with Fall? 0 - - - -    Relevant past medical, surgical, family and social history reviewed Past Medical History:  Diagnosis Date  . Breast cancer [facility]) 2007   Right, radiation treatment [facility]  . Colon cancer ([place]) 2009  . Colon polyps   . Hypertension 1990  . Osteopenia 10/26/2017   Next DEXA due Jan 2021  . Personal history of radiation therapy    Past Surgical History:  Procedure Laterality Date  . ABDOMINAL HYSTERECTOMY    . BREAST BIOPSY  1997  . BREAST BIOPSY Right 2007   Stereo, positive  . BREAST SURGERY Right 2007   lumpectomy  . COLECTOMY  2009   colectomy for polyp wuth in-situ [place]  . COLONOSCOPY  [id number]   polyps  . salpingo oophorectmy     Family History  Problem Relation Age of Onset  . Cancer  Paternal Aunt        breast  . Hypertension Sister   . Hyperlipidemia Sister   . Hyperlipidemia Brother   . Hypertension Brother   . Hyperlipidemia Brother   . Hypertension Brother   . Breast cancer Neg Hx    Social History   Tobacco Use  . Smoking status: Never Smoker  . Smokeless tobacco: Never Used  Substance Use Topics  . Alcohol use: No    Alcohol/week: 0.0 standard drinks  . Drug use: No     Office Visit from 12/12/2018 in [facility]  AUDIT-C Score  0      Interim medical history since last visit reviewed. Allergies and medications reviewed  Review of Systems Per HPI unless specifically indicated above     Objective:    BP 116/78   Pulse 72   Temp 98.5 F (36.9 C) (Oral)   Resp 12   Ht 5\' 1"  (1.549 m)   Wt 111 lb (50.3 kg)   SpO2 95%   BMI 20.97 kg/m   Wt Readings from Last 3 Encounters:  12/12/18 111 lb (50.3 kg)  06/09/18 112 lb 4.8 oz (50.9 kg)  12/09/17 114 lb 3.2 oz (51.8 kg)    Physical Exam Constitutional:       General: She is not in acute distress.    Appearance: She is well-developed. She is not diaphoretic.  HENT:     Head: Normocephalic and atraumatic.     Right Ear: Tympanic membrane and ear canal normal. There is no impacted cerumen.     Left Ear: There is impacted cerumen.     Ears:     Comments: Impacted cerumen LEFT ear canal only; removed under direct visualization by MD with good results Eyes:     General: No scleral icterus. Neck:     Thyroid: No thyromegaly.  Cardiovascular:     Rate and Rhythm: Normal rate and regular rhythm.     Heart sounds: Normal heart sounds. No murmur.  Pulmonary:     Effort: Pulmonary effort is normal. No respiratory distress.     Breath sounds: Normal breath sounds. No wheezing.  Abdominal:     General: Bowel sounds are normal. There is no distension.     Palpations: Abdomen is soft.  Skin:    General: Skin is warm and dry.     Coloration: Skin is not pale.  Neurological:     Mental Status: She is alert.  Psychiatric:        Mood and Affect: Mood is not anxious or depressed.        Behavior: Behavior is slowed and withdrawn.     Comments: Poor historian   RIGHT Knee enlarged, crepitus with active flexion/extension  Results for orders placed or performed in visit on 12/09/17  VITAMIN D 25 Hydroxy (Vit-D Deficiency, Fractures)  Result Value Ref Range   Vit D, 25-Hydroxy 36 30 - 100 ng/mL  Lipid panel  Result Value Ref Range   Cholesterol 196 <200 mg/dL   HDL 65 >50 mg/dL   Triglycerides 150 (H) <150 mg/dL   LDL Cholesterol (Calc) 105 (H) mg/dL (calc)   Total CHOL/HDL Ratio 3.0 <5.0 (calc)   Non-HDL Cholesterol (Calc) 131 (H) <130 mg/dL (calc)  CBC with Differential/Platelet  Result Value Ref Range   WBC 6.2 3.8 - 10.8 Thousand/uL   RBC 4.56 3.80 - 5.10 Million/uL   Hemoglobin 12.0 11.7 - 15.5 g/dL   HCT 36.6 35.0 - 45.0 %  MCV 80.3 80.0 - 100.0 fL   MCH 26.3 (L) 27.0 - 33.0 pg   MCHC 32.8 32.0 - 36.0 g/dL   RDW 15.3 (H) 11.0 - 15.0  %   Platelets 202 140 - 400 Thousand/uL   MPV 10.7 7.5 - 12.5 fL   Neutro Abs 3,509 1,500 - 7,800 cells/uL   Lymphs Abs 2,077 850 - 3,900 cells/uL   WBC mixed population 539 200 - 950 cells/uL   Eosinophils Absolute 62 15 - 500 cells/uL   Basophils Absolute 12 0 - 200 cells/uL   Neutrophils Relative % 56.6 %   Total Lymphocyte 33.5 %   Monocytes Relative 8.7 %   Eosinophils Relative 1.0 %   Basophils Relative 0.2 %  COMPLETE METABOLIC PANEL WITH GFR  Result Value Ref Range   Glucose, Bld 79 65 - 139 mg/dL   BUN 12 7 - 25 mg/dL   Creat 0.87 0.60 - 0.88 mg/dL   GFR, Est Non African American 62 > OR = 60 mL/min/1.36m2   GFR, Est African American 72 > OR = 60 mL/min/1.3m2   BUN/Creatinine Ratio NOT APPLICABLE 6 - 22 (calc)   Sodium 142 135 - 146 mmol/L   Potassium 3.9 3.5 - 5.3 mmol/L   Chloride 106 98 - 110 mmol/L   CO2 26 20 - 32 mmol/L   Calcium 9.2 8.6 - 10.4 mg/dL   Total Protein 6.5 6.1 - 8.1 g/dL   Albumin 3.9 3.6 - 5.1 g/dL   Globulin 2.6 1.9 - 3.7 g/dL (calc)   AG Ratio 1.5 1.0 - 2.5 (calc)   Total Bilirubin 0.4 0.2 - 1.2 mg/dL   Alkaline phosphatase (APISO) 57 33 - 130 U/L   AST 14 10 - 35 U/L   ALT 7 6 - 29 U/L  B12  Result Value Ref Range   Vitamin B-12 321 200 - 1,100 pg/mL      Assessment & Plan:   Problem List Items Addressed This Visit      Cardiovascular and Mediastinum   BP (high blood pressure)    Controlled; continue medicine but move to night-time; skip tomorrow morning and give at bedtime; limit salt      Relevant Medications   lisinopril (PRINIVIL,ZESTRIL) 5 MG tablet     Nervous and Auditory   Dementia [facility])    Offered neurologist appointment; keep things as they are she wishes; no wandering behavior, no behavioral disturbances        Musculoskeletal and Integument   Osteopenia    Next DEXA Jan 2021; fall precautions      OA (osteoarthritis) of knee - Primary    Try turmeric and tylenol and ice, protection between ice and skin; she  politely declined seeing ortho or having xrays but she is welcome to call for either if changes mind        Other   Medication monitoring encounter   Relevant Orders   CBC   COMPLETE METABOLIC PANEL WITH GFR   Hyperlipidemia   Relevant Medications   lisinopril (PRINIVIL,ZESTRIL) 5 MG tablet   Other Relevant Orders   Lipid panel   Vitamin B12 deficiency    Taking supplement; okay not rechecking per sister      Do not resuscitate status    Still the same wishes; paperwork given to sister, [name]       Other Visit Diagnoses    Impacted cerumen of left ear       removed by MD under direct visualization wtih curette  Follow up plan: Return in about 6 months (around 06/12/2019) for follow-up visit with Dr. Sanda Klein.  An after-visit summary was printed and given to the patient at [place].  Please see the patient instructions which may contain other information and recommendations beyond what is mentioned above in the assessment and plan.  Meds ordered this encounter  Medications  . DISCONTD: lisinopril (PRINIVIL,ZESTRIL) 5 MG tablet    Sig: Take 1 tablet (5 mg total) by mouth daily.    Dispense:  90 tablet    Refill:  0  . lisinopril (PRINIVIL,ZESTRIL) 5 MG tablet    Sig: Take 1 tablet (5 mg total) by mouth at bedtime.    Dispense:  90 tablet    Refill:  0    **Use this RX instead**    Orders Placed This Encounter  Procedures  . CBC  . COMPLETE METABOLIC PANEL WITH GFR  . Lipid panel    [name] (sister) cell phone: [phone]

## 2018-12-12 NOTE — Assessment & Plan Note (Signed)
Still the same wishes; paperwork given to sister, [name]

## 2018-12-12 NOTE — Patient Instructions (Addendum)
Try turmeric as a natural anti-inflammatory (for pain and arthritis). It comes in capsules where you buy aspirin and fish oil, but also as a spice where you buy pepper and garlic powder.  It would okay to take 500 mg of Tylenol (acetaminophen) two or three times a day for pain

## 2018-12-12 NOTE — Assessment & Plan Note (Signed)
Controlled; continue medicine but move to night-time; skip tomorrow morning and give at bedtime; limit salt

## 2018-12-12 NOTE — Assessment & Plan Note (Signed)
Taking supplement; okay not rechecking per sister

## 2018-12-13 LAB — COMPLETE METABOLIC PANEL WITH GFR
AG RATIO: 1.4 (calc) (ref 1.0–2.5)
ALT: 10 U/L (ref 6–29)
AST: 16 U/L (ref 10–35)
Albumin: 3.9 g/dL (ref 3.6–5.1)
Alkaline phosphatase (APISO): 60 U/L (ref 37–153)
BILIRUBIN TOTAL: 0.4 mg/dL (ref 0.2–1.2)
BUN: 13 mg/dL (ref 7–25)
CO2: 24 mmol/L (ref 20–32)
Calcium: 9.2 mg/dL (ref 8.6–10.4)
Chloride: 105 mmol/L (ref 98–110)
Creat: 0.71 mg/dL (ref 0.60–0.88)
GFR, EST NON AFRICAN AMERICAN: 79 mL/min/{1.73_m2} (ref >=60)
GFR, Est African American: 92 mL/min/{1.73_m2} (ref >=60)
GLOBULIN: 2.8 g/dL (ref 1.9–3.7)
Glucose, Bld: 72 mg/dL (ref 65–99)
POTASSIUM: 4 mmol/L (ref 3.5–5.3)
SODIUM: 140 mmol/L (ref 135–146)
Total Protein: 6.7 g/dL (ref 6.1–8.1)

## 2018-12-13 LAB — CBC
HEMATOCRIT: 39.6 % (ref 35.0–45.0)
Hemoglobin: 12.8 g/dL (ref 11.7–15.5)
MCH: 27.1 pg (ref 27.0–33.0)
MCHC: 32.3 g/dL (ref 32.0–36.0)
MCV: 83.7 fL (ref 80.0–100.0)
MPV: 11.4 fL (ref 7.5–12.5)
PLATELETS: 204 10*3/uL (ref 140–400)
RBC: 4.73 10*6/uL (ref 3.80–5.10)
RDW: 14.4 % (ref 11.0–15.0)
WBC: 5.2 10*3/uL (ref 3.8–10.8)

## 2018-12-13 LAB — LIPID PANEL
CHOLESTEROL: 207 mg/dL — AB (ref <200)
HDL: 67 mg/dL (ref >=50)
LDL Cholesterol (Calc): 116 mg/dL (calc) — ABNORMAL HIGH
NON-HDL CHOLESTEROL (CALC): 140 mg/dL — AB (ref <130)
TRIGLYCERIDES: 128 mg/dL (ref <150)
Total CHOL/HDL Ratio: 3.1 (calc) (ref <5.0)

## 2018-12-13 NOTE — Progress Notes (Signed)
Pamela Fox, please let the patient know also... Her glucose, liver, and kidney function tests are in the expected ranges. In fact, her kidney function has improved over the last two years. Her HDL cholesterol is wonderful. Her LDL is a little above normal. Please check with her pharmacy to see if there have been fills for her cholesterol medicine. It doesn't look like it's been prescribed / refilled since 2018. Perhaps her sister doesn't want her to continue this (??).

## 2018-12-14 ENCOUNTER — Other Ambulatory Visit: Payer: Self-pay | Admitting: Family Medicine

## 2018-12-14 MED ORDER — ATORVASTATIN CALCIUM 40 MG PO TABS: 40.0000 mg | ORAL_TABLET | Freq: Every day | ORAL | 5 refills | Status: DC

## 2018-12-14 NOTE — Telephone Encounter (Signed)
See request for new Rx for Atorvastatin.

## 2018-12-14 NOTE — Telephone Encounter (Signed)
New Rx sent Glad they are going to work on diet

## 2018-12-14 NOTE — Telephone Encounter (Signed)
Copied from [place] [phone]. Topic: Quick Communication - Rx Refill/Question >> Dec 14, 2018 11:12 AM Reyne Dumas L wrote: Medication:  atorvastatin (LIPITOR) 40 MG tablet  [name], sister/caregiver/POA, called in.  States she got lab results and she hasn't been giving pt her atorvastatin.  [name] would like a new script called in so that they can start that back.  Sister also states she is going to change up pt's diet because she controls her cholesterol with oatmeal.  Has the patient contacted their pharmacy? No - needs new script (Agent: If no, request that the patient contact the pharmacy for the refill.) (Agent: If yes, when and what did the pharmacy advise?)  Preferred Pharmacy (with phone number or street name): [place], [place] - [address] AT [facility] [phone] (Phone) [phone] (Fax)  Agent: Please be advised that RX refills may take up to 3 business days. We ask that you follow-up with your pharmacy.

## 2019-01-11 ENCOUNTER — Other Ambulatory Visit: Payer: Self-pay | Admitting: Family Medicine

## 2019-04-25 ENCOUNTER — Other Ambulatory Visit: Payer: Self-pay | Admitting: Nurse Practitioner

## 2019-06-12 ENCOUNTER — Ambulatory Visit (INDEPENDENT_AMBULATORY_CARE_PROVIDER_SITE_OTHER): Payer: Medicare Other | Admitting: Nurse Practitioner

## 2019-06-12 ENCOUNTER — Other Ambulatory Visit: Payer: Self-pay

## 2019-06-12 ENCOUNTER — Encounter: Payer: Self-pay | Admitting: Nurse Practitioner

## 2019-06-12 VITALS — BP 118/82 | HR 76 | Temp 97.8°F | Wt 112.0 lb

## 2019-06-12 DIAGNOSIS — F0391 Unspecified dementia with behavioral disturbance: Secondary | ICD-10-CM | POA: Diagnosis not present

## 2019-06-12 DIAGNOSIS — M8580 Other specified disorders of bone density and structure, unspecified site: Secondary | ICD-10-CM | POA: Diagnosis not present

## 2019-06-12 DIAGNOSIS — E538 Deficiency of other specified B group vitamins: Secondary | ICD-10-CM | POA: Diagnosis not present

## 2019-06-12 DIAGNOSIS — E559 Vitamin D deficiency, unspecified: Secondary | ICD-10-CM | POA: Diagnosis not present

## 2019-06-12 DIAGNOSIS — I10 Essential (primary) hypertension: Secondary | ICD-10-CM | POA: Diagnosis not present

## 2019-06-12 DIAGNOSIS — E782 Mixed hyperlipidemia: Secondary | ICD-10-CM | POA: Diagnosis not present

## 2019-06-12 MED ORDER — LISINOPRIL 5 MG PO TABS: 5.0000 mg | ORAL_TABLET | Freq: Every day | ORAL | 0 refills | Status: DC

## 2019-06-12 MED ORDER — MEMANTINE HCL 5 MG PO TABS
5.0000 mg | ORAL_TABLET | Freq: Every day | ORAL | 0 refills | Status: DC
Start: 2019-06-12 — End: 2019-06-14

## 2019-06-12 MED ORDER — DONEPEZIL HCL 10 MG PO TABS: 10.0000 mg | ORAL_TABLET | Freq: Every day | ORAL | 0 refills | Status: DC

## 2019-06-12 NOTE — Progress Notes (Signed)
Virtual Visit via Video Note  I connected with Pamela Fox on 06/12/19 at 11:00 AM EDT by a video enabled telemedicine application and verified that I am speaking with the correct person using two identifiers.   Staff discussed the limitations of evaluation and management by telemedicine and the availability of in person appointments. The patient expressed understanding and agreed to proceed.  Patient location: home  My location: work office Other people present: [name], sister/caregiver and POA HPI  Hypertension Patient is on lisinopril 5mg  daily.  Takes medications as prescribed with no missed doses a month.  She compliant with low-salt diet.  Denies chest pain, headaches, blurry vision.  Dementia with behavioral disturbance,  She is alert and oriented to self, and sister. Progressively worsening, has some decreased appetite, but sister is supplementing with ensure and she has come up to her normal weight. She does have some hallucinations per sister but very rarely. She is not aggressive.  Taking aricept 10mg  daily. Has not seen neurologist.   Osteopenia Takes vitamin D and calcium supplementation Weight bearing exercises: walks around the house all day long  Assistive devices: none   Hyperlipidemia Patient rx lipitor 40mg  daily. Sister noticed that she hurts more and is sluggish when taking this medicine.  Denies myalgias Lab Results  Component Value Date   CHOL 207 (H) 12/12/2018   HDL 67 12/12/2018   LDLCALC 116 (H) 12/12/2018   TRIG 128 12/12/2018   CHOLHDL 3.1 12/12/2018    Vitamin B12 deficiency She is taking B12 122mcg faily   PHQ2/9: Depression screen [facility] 2/9 06/12/2019 12/12/2018 06/09/2018 12/09/2017 05/31/2017  Decreased Interest 0 0 0 0 0  Down, Depressed, Hopeless 0 0 0 0 0  PHQ - 2 Score 0 0 0 0 0  Altered sleeping 0 0 - - -  Tired, decreased energy 0 0 - - -  Change in appetite 0 0 - - -  Feeling bad or failure about yourself  0 0 - - -   Trouble concentrating 0 0 - - -  Moving slowly or fidgety/restless 0 0 - - -  Suicidal thoughts 0 0 - - -  PHQ-9 Score 0 0 - - -  Difficult doing work/chores Not difficult at all Not difficult at all - - -     PHQ reviewed. Negative  Patient Active Problem List   Diagnosis Date Noted  . OA (osteoarthritis) of knee 12/12/2018  . Vitamin B12 deficiency 06/09/2018  . Do not resuscitate status 12/09/2017  . Osteopenia 10/26/2017  . Postmenopausal disorder 05/31/2017  . Thoracic kyphosis 05/31/2017  . Vitamin D deficiency 05/31/2017  . Medication monitoring encounter 05/27/2016  . BP (high blood pressure) 04/17/2015  . Dementia ([place]) 04/17/2015  . Hyperlipidemia 04/17/2015  . Personal history of colon cancer 04/24/2013  . Personal history of breast cancer 04/24/2013    Past Medical History:  Diagnosis Date  . Breast cancer [facility]) 2007   Right, radiation treatment [facility]  . Colon cancer ([place]) 2009  . Colon polyps   . Hypertension 1990  . Osteopenia 10/26/2017   Next DEXA due Jan 2021  . Personal history of radiation therapy     Past Surgical History:  Procedure Laterality Date  . ABDOMINAL HYSTERECTOMY    . BREAST BIOPSY  1997  . BREAST BIOPSY Right 2007   Stereo, positive  . BREAST SURGERY Right 2007   lumpectomy  . COLECTOMY  2009   colectomy for polyp wuth in-situ CA  . COLONOSCOPY  [id number]   polyps  . salpingo oophorectmy      Social History   Tobacco Use  . Smoking status: Never Smoker  . Smokeless tobacco: Never Used  Substance Use Topics  . Alcohol use: No    Alcohol/week: 0.0 standard drinks     Current Outpatient Medications:  .  aspirin EC 81 MG tablet, Take 81 mg by mouth 3 (three) times a week. , Disp: , Rfl:  .  Calcium Carb-Cholecalciferol (CALCIUM-VITAMIN D) 500-200 MG-UNIT tablet, Take 1 tablet by mouth daily., Disp: , Rfl:  .  COMBIGAN 0.2-0.5 % ophthalmic solution, , Disp: , Rfl:  .  donepezil (ARICEPT) 10 MG tablet, TAKE 1 TABLET BY  MOUTH AT BEDTIME, Disp: 90 tablet, Rfl: 0 .  dorzolamide (TRUSOPT) 2 % ophthalmic solution, , Disp: , Rfl: 0 .  latanoprost (XALATAN) 0.005 % ophthalmic solution, , Disp: , Rfl:  .  lisinopril (PRINIVIL,ZESTRIL) 5 MG tablet, Take 1 tablet (5 mg total) by mouth at bedtime., Disp: 90 tablet, Rfl: 0 .  vitamin B-12 (CYANOCOBALAMIN) 100 MCG tablet, Take 100 mcg by mouth daily., Disp: , Rfl:  .  atorvastatin (LIPITOR) 40 MG tablet, Take 1 tablet (40 mg total) by mouth at bedtime. (Patient not taking: Reported on 06/12/2019), Disp: 30 tablet, Rfl: 5  No Known Allergies  ROS   No other specific complaints in a complete review of systems (except as listed in HPI above).  Objective  Vitals:   06/12/19 1037  BP: 118/82  Pulse: 76  Temp: 97.8 F (36.6 C)  Weight: 112 lb (50.8 kg)    Body mass index is 21.16 kg/m.  Nursing Note and Vital Signs reviewed.  Physical Exam  Constitutional: Patient appears well-developed and well-nourished. No distress.  HENT: Head: Normocephalic and atraumatic. Cardiovascular: Normal rate Pulmonary/Chest: Effort normal  Musculoskeletal: Normal range of motion,  Neurological: alert and disoriented to situation and location, speech normal.  Psychiatric: Patient has a normal mood and affect. behavior is normal. Judgment and thought content normal.    Assessment & Plan  1. Essential hypertension stable - lisinopril (ZESTRIL) 5 MG tablet; Take 1 tablet (5 mg total) by mouth at bedtime.  Dispense: 90 tablet; Refill: 0  2. Dementia with behavioral disturbance, unspecified dementia type ([place]) Add on namenda- follow-up in 2-3 months consider increasing dose if needed, discussed goals of care - memantine (NAMENDA) 5 MG tablet; Take 1 tablet (5 mg total) by mouth daily.  Dispense: 90 tablet; Refill: 0 - donepezil (ARICEPT) 10 MG tablet; Take 1 tablet (10 mg total) by mouth at bedtime.  Dispense: 90 tablet; Refill: 0  3. Osteopenia, unspecified  location Continue supplementation and weight bearing exercises   4. Mixed hyperlipidemia Discussed goals of therapy with progressive dementia- will not trial other cholesterol medicines at this time for minimally elevated cholesterol.   5. Vitamin D deficiency Continue supplementation   6. Vitamin B12 deficiency Continue supplementation      Follow Up Instructions:   3 months  I discussed the assessment and treatment plan with the patient. The patient was provided an opportunity to ask questions and all were answered. The patient agreed with the plan and demonstrated an understanding of the instructions.   The patient was advised to call back or seek an in-person evaluation if the symptoms worsen or if the condition fails to improve as anticipated.  I provided 23 minutes of non-face-to-face time during this encounter.   Fredderick Severance, NP

## 2019-06-14 ENCOUNTER — Ambulatory Visit: Payer: Self-pay | Admitting: *Deleted

## 2019-06-14 NOTE — Addendum Note (Signed)
Addended by: Fredderick Severance on: 06/14/2019 02:52 PM   Modules accepted: Orders

## 2019-06-14 NOTE — Telephone Encounter (Signed)
Patient's sister, [name] calls explaining the patient started on namenda 5 mg tabs last night along with the aricept she had been on.  Reporting she began having diarrhea early this morning about 4 episodes already. No blood in stool. Voiding without difficulty no abdominal pain no dizziness no fever.  Reviewed care advice including hydration/bland foods/continue the OTC anti diarrheal tabs according to the directions.If no improvement call back. She does not want to continue with the namenda due to the diarrhea. Discussed should she have abdominal pain/blood in stool/dizziness/vomiting occur, call back immediately.  Stated understanding.  Routing to provider for further advice.                          rReason for Disposition . [1] MODERATE diarrhea (e.g., 4-6 times / day more than normal) AND [2] age > 70 years  Answer Assessment - Initial Assessment Questions 1. DIARRHEA SEVERITY: "How bad is the diarrhea?" "How many extra stools have you had in the past 24 hours than normal?"    - NO DIARRHEA (SCALE 0)   - MILD (SCALE 1-3): Few loose or mushy BMs; increase of 1-3 stools over normal daily number of stools; mild increase in ostomy output.   -  MODERATE (SCALE 4-7): Increase of 4-6 stools daily over normal; moderate increase in ostomy output. * SEVERE (SCALE 8-10; OR 'WORST POSSIBLE'): Increase of 7 or more stools daily over normal; moderate increase in ostomy output; incontinence.     4-5 2. ONSET: "When did the diarrhea begin?"      Started early mornng 3. BM CONSISTENCY: "How loose or watery is the diarrhea?"    Partly watery 4. VOMITING: "Are you also vomiting?" If so, ask: "How many times in the past 24 hours?"      No, just spit up once 5. ABDOMINAL PAIN: "Are you having any abdominal pain?" If yes: "What does it feel like?" (e.g., crampy, dull, intermittent, constant)      None noticed or voiced from patient 6. ABDOMINAL PAIN SEVERITY: If present, ask: "How bad is the pain?"  (e.g., Scale  1-10; mild, moderate, or severe)   - MILD (1-3): doesn't interfere with normal activities, abdomen soft and not tender to touch    - MODERATE (4-7): interferes with normal activities or awakens from sleep, tender to touch    - SEVERE (8-10): excruciating pain, doubled over, unable to do any normal activities       0 7. ORAL INTAKE: If vomiting, "Have you been able to drink liquids?" "How much fluids have you had in the past 24 hours?"    Yes, drinking Pedialyte without difficulty 9. EXPOSURE: "Have you traveled to a foreign country recently?" "Have you been exposed to anyone with diarrhea?" "Could you have eaten any food that was spoiled?"     no 10. ANTIBIOTIC USE: "Are you taking antibiotics now or have you taken antibiotics in the past 2 months?"        11. OTHER SYMPTOMS: "Do you have any other symptoms?" (e.g., fever, blood in stool)       no 12. PREGNANCY: "Is there any chance you are pregnant?" "When was your last menstrual period?"       na  Protocols used: [facility]

## 2019-06-14 NOTE — Telephone Encounter (Signed)
Patient's sister was informed and she said ok.

## 2019-06-14 NOTE — Telephone Encounter (Signed)
Agree it is best to stop the namenda, discontinued in chart. Eat bland foods, drink plenty of fluids. If having severe abdominal cramping, lethargy, blood in stools get urgent medical attention. Symptoms should resolve in 1-3 days. We can keep her medications where they are on her 10mg  dose of aricept for now while her symptoms resolve and can consider going up on dose in the future.

## 2019-06-28 ENCOUNTER — Telehealth: Payer: Self-pay

## 2019-06-28 NOTE — Telephone Encounter (Signed)
Copied from [place] [phone]. Topic: General - Other >> Jun 28, 2019  9:33 AM Antonieta Iba C wrote: Reason for CRM: [name] pt's sister called in to make provider aware, she started back giving pt memantine (NAMENDA) 5 MG tablet on August 31 with her breakfast. She said that it seems to be working /helping better that way.

## 2019-07-06 ENCOUNTER — Telehealth: Payer: Self-pay | Admitting: Family Medicine

## 2019-07-06 NOTE — Telephone Encounter (Signed)
Medication Refill - Medication: lisinopril (ZESTRIL) 5 MG tablet   Has the patient contacted their pharmacy? Yes.   (Agent: If no, request that the patient contact the pharmacy for the refill.) (Agent: If yes, when and what did the pharmacy advise?)  Preferred Pharmacy (with phone number or street name):  [facility] STORE [name], [place] - [place] AT [facility]  [address] [place] [address]  Phone: [phone] Fax: [phone]  Not a 24 hour pharmacy; exact hours not known.     Agent: Please be advised that RX refills may take up to 3 business days. We ask that you follow-up with your pharmacy.

## 2019-07-06 NOTE — Telephone Encounter (Signed)
Called into pharmacy

## 2019-07-31 ENCOUNTER — Other Ambulatory Visit: Payer: Self-pay

## 2019-07-31 DIAGNOSIS — F0391 Unspecified dementia with behavioral disturbance: Secondary | ICD-10-CM

## 2019-07-31 MED ORDER — DONEPEZIL HCL 10 MG PO TABS: 10.0000 mg | ORAL_TABLET | Freq: Every day | ORAL | 0 refills | Status: DC

## 2019-09-11 ENCOUNTER — Ambulatory Visit (INDEPENDENT_AMBULATORY_CARE_PROVIDER_SITE_OTHER): Payer: Medicare Other | Admitting: Family Medicine

## 2019-09-11 ENCOUNTER — Encounter: Payer: Self-pay | Admitting: Family Medicine

## 2019-09-11 VITALS — BP 119/80 | HR 80 | Temp 97.0°F | Ht 61.0 in | Wt 110.0 lb

## 2019-09-11 DIAGNOSIS — M8580 Other specified disorders of bone density and structure, unspecified site: Secondary | ICD-10-CM | POA: Diagnosis not present

## 2019-09-11 DIAGNOSIS — F0391 Unspecified dementia with behavioral disturbance: Secondary | ICD-10-CM | POA: Diagnosis not present

## 2019-09-11 DIAGNOSIS — M1711 Unilateral primary osteoarthritis, right knee: Secondary | ICD-10-CM | POA: Diagnosis not present

## 2019-09-11 DIAGNOSIS — I10 Essential (primary) hypertension: Secondary | ICD-10-CM

## 2019-09-11 DIAGNOSIS — E559 Vitamin D deficiency, unspecified: Secondary | ICD-10-CM

## 2019-09-11 DIAGNOSIS — Z5181 Encounter for therapeutic drug level monitoring: Secondary | ICD-10-CM | POA: Diagnosis not present

## 2019-09-11 DIAGNOSIS — E538 Deficiency of other specified B group vitamins: Secondary | ICD-10-CM | POA: Diagnosis not present

## 2019-09-11 MED ORDER — DONEPEZIL HCL 10 MG PO TABS: 10.0000 mg | ORAL_TABLET | Freq: Every day | ORAL | 0 refills | Status: DC

## 2019-09-11 MED ORDER — MEMANTINE HCL 5 MG PO TABS: 5.0000 mg | ORAL_TABLET | Freq: Every day | ORAL | 1 refills | Status: AC

## 2019-09-11 NOTE — Progress Notes (Signed)
Name: Pamela Fox   MRN: [id number]    DOB: [birth year]   Date:09/11/2019       Progress Note  Subjective:    Chief Complaint  Chief Complaint  Patient presents with  . Follow-up  . Medication Refill  . Dementia    still on namenda and aricept.    I connected with  Ortencia Kick  on 09/11/19 at 10:00 AM EST by a video enabled telemedicine application and verified that I am speaking with the correct person using two identifiers.  I discussed the limitations of evaluation and management by telemedicine and the availability of in person appointments. The patient expressed understanding and agreed to proceed. Staff also discussed with the patient that there may be a patient responsible charge related to this service.  Patient Location: home Provider Location: [facility] clinic Additional Individuals present: her sister -  [name]   HPI  Hypertension:  HTN stable Currently managed on lisinopril 5 mg Pt reports good med compliance and denies any Se, though pt's sister and caregiver says she may not report sx even if she has them.  No lightheadedness, hypotension, syncope. Blood pressure today is well controlled. BP Readings from Last 3 Encounters:  09/11/19 119/80  06/12/19 118/82  12/12/18 116/78   Pt denies CP, SOB, exertional sx, LE edema, palpitation, Ha's, visual disturbances Dietary efforts for BP?  Healthy balanced   Dementia: Med refill on Aricept and Namenda, Namenda was started only few months ago when medications were initiated patient experienced diarrhea so medicines were held for about a week and when restarted the Aricept and Namenda were given to her at different times a day and she no longer had any diarrhea.  History given by patient's Sister [name] who is her caregiver and power of attorney, she reports that her mental status is at her baseline she has not seen any rapid cognitive decline.  She states that she is active walks a lot during the day, has a very good  appetite.  She states that she is not sure if she is aware what is going on with her body but she does complain of right knee pain with history of known right knee osteoarthritis.    Patient is supplementing with vitamin B12, calcium and vitamin D.  She takes baby aspirin every other day.  They would not like to come in for labs for few months would like to get through some of the winter and holidays come in next year around February.  Patient is new to me I reviewed her chart she does have a DNR, looks that over the past couple years medications have been weaned off if able and lab work has been done about once a year   Patient Active Problem List   Diagnosis Date Noted  . OA (osteoarthritis) of knee 12/12/2018  . Vitamin B12 deficiency 06/09/2018  . Do not resuscitate status 12/09/2017  . Osteopenia 10/26/2017  . Postmenopausal disorder 05/31/2017  . Thoracic kyphosis 05/31/2017  . Vitamin D deficiency 05/31/2017  . Medication monitoring encounter 05/27/2016  . BP (high blood pressure) 04/17/2015  . Dementia ([place]) 04/17/2015  . Hyperlipidemia 04/17/2015  . Personal history of colon cancer 04/24/2013  . Personal history of breast cancer 04/24/2013    Past Surgical History:  Procedure Laterality Date  . ABDOMINAL HYSTERECTOMY    . BREAST BIOPSY  1997  . BREAST BIOPSY Right 2007   Stereo, positive  . BREAST SURGERY Right 2007   lumpectomy  .  COLECTOMY  2009   colectomy for polyp wuth in-situ CA  . COLONOSCOPY  [id number]   polyps  . salpingo oophorectmy      Family History  Problem Relation Age of Onset  . Cancer Paternal Aunt        breast  . Hypertension Sister   . Hyperlipidemia Sister   . Hyperlipidemia Brother   . Hypertension Brother   . Hyperlipidemia Brother   . Hypertension Brother   . Breast cancer Neg Hx     Social History   Socioeconomic History  . Marital status: Single    Spouse name: Not on file  . Number of children: Not on file  . Years of  education: Not on file  . Highest education level: Not on file  Occupational History  . Not on file  Social Needs  . Financial resource strain: Not on file  . Food insecurity    Worry: Not on file    Inability: Not on file  . Transportation needs    Medical: Not on file    Non-medical: Not on file  Tobacco Use  . Smoking status: Never Smoker  . Smokeless tobacco: Never Used  Substance and Sexual Activity  . Alcohol use: No    Alcohol/week: 0.0 standard drinks  . Drug use: No  . Sexual activity: Not Currently  Lifestyle  . Physical activity    Days per week: Not on file    Minutes per session: Not on file  . Stress: Not on file  Relationships  . Social Herbalist on phone: Not on file    Gets together: Not on file    Attends religious service: Not on file    Active member of club or organization: Not on file    Attends meetings of clubs or organizations: Not on file    Relationship status: Not on file  . Intimate partner violence    Fear of current or ex partner: Not on file    Emotionally abused: Not on file    Physically abused: Not on file    Forced sexual activity: Not on file  Other Topics Concern  . Not on file  Social History Narrative  . Not on file     Current Outpatient Medications:  .  aspirin EC 81 MG tablet, Take 81 mg by mouth 3 (three) times a week. , Disp: , Rfl:  .  Calcium Carb-Cholecalciferol (CALCIUM-VITAMIN D) 500-200 MG-UNIT tablet, Take 1 tablet by mouth daily., Disp: , Rfl:  .  COMBIGAN 0.2-0.5 % ophthalmic solution, , Disp: , Rfl:  .  donepezil (ARICEPT) 10 MG tablet, Take 1 tablet (10 mg total) by mouth at bedtime., Disp: 90 tablet, Rfl: 0 .  dorzolamide (TRUSOPT) 2 % ophthalmic solution, , Disp: , Rfl: 0 .  latanoprost (XALATAN) 0.005 % ophthalmic solution, , Disp: , Rfl:  .  lisinopril (ZESTRIL) 5 MG tablet, Take 1 tablet (5 mg total) by mouth at bedtime., Disp: 90 tablet, Rfl: 0 .  vitamin B-12 (CYANOCOBALAMIN) 100 MCG tablet,  Take 100 mcg by mouth daily., Disp: , Rfl:   No Known Allergies  I personally reviewed active problem list, medication list, allergies, family history, social history, health maintenance, notes from last encounter, lab results, imaging with the patient/caregiver today.  Review of Systems  Constitutional: Negative.   HENT: Negative.   Eyes: Negative.   Respiratory: Negative.   Cardiovascular: Negative.   Gastrointestinal: Negative.   Endocrine: Negative.   Genitourinary:  Negative.   Musculoskeletal: Negative.   Skin: Negative.   Allergic/Immunologic: Negative.   Neurological: Negative.   Hematological: Negative.   Psychiatric/Behavioral: Negative.   All other systems reviewed and are negative.     Objective:    Virtual encounter, vitals limited, only able to obtain the following Today's Vitals   09/11/19 1000  BP: 119/80  Pulse: 80  Temp: (!) 97 F (36.1 C)  Weight: 110 lb (49.9 kg)  Height: 5\' 1"  (1.549 m)   Body mass index is 20.78 kg/m. Nursing Note and Vital Signs reviewed.  Physical Exam Vitals signs and nursing note reviewed.  Constitutional:      Appearance: She is well-developed.     Comments: Thin elderly female, generally well appearing, NAD  HENT:     Head: Normocephalic and atraumatic.     Nose: Nose normal.  Eyes:     General:        Right eye: No discharge.        Left eye: No discharge.     Conjunctiva/sclera: Conjunctivae normal.  Neck:     Trachea: No tracheal deviation.  Pulmonary:     Effort: Pulmonary effort is normal. No respiratory distress.     Breath sounds: No stridor.  Skin:    Coloration: Skin is not jaundiced or pale.     Findings: No rash.  Neurological:     Mental Status: She is alert. Mental status is at baseline.     Motor: No abnormal muscle tone.  Psychiatric:        Mood and Affect: Mood normal.        Behavior: Behavior normal.     PE limited by telephone encounter  No results found for this or any previous  visit (from the past 72 hour(s)).  PHQ2/9: Depression screen [facility] 2/9 09/11/2019 06/12/2019 12/12/2018 06/09/2018 12/09/2017  Decreased Interest 0 0 0 0 0  Down, Depressed, Hopeless 0 0 0 0 0  PHQ - 2 Score 0 0 0 0 0  Altered sleeping 0 0 0 - -  Tired, decreased energy 0 0 0 - -  Change in appetite 0 0 0 - -  Feeling bad or failure about yourself  0 0 0 - -  Trouble concentrating 0 0 0 - -  Moving slowly or fidgety/restless 0 0 0 - -  Suicidal thoughts 0 0 0 - -  PHQ-9 Score 0 0 0 - -  Difficult doing work/chores - Not difficult at all Not difficult at all - -   PHQ-2/9 Result negative - limited with reports given by sister  Fall Risk: Fall Risk  09/11/2019 06/12/2019 12/12/2018 06/09/2018 12/09/2017  Falls in the past year? 0 0 0 No No  Number falls in past yr: 0 0 0 - -  Injury with Fall? 0 0 0 - -     Assessment and Plan:     ICD-10-CM   1. Dementia with behavioral disturbance, unspecified dementia type (HCC)  F03.91 donepezil (ARICEPT) 10 MG tablet   tolerating aricept and namenda, has to take as different times of day, no rapid cognitive decline, pt reported to be at baseline, no mood changes  2. Essential hypertension  I10    BP good, with age, trial of holding lisinopril 5 mg for 1-2 weeks, monitor BP, if it remains <140/90 may be able to d/c as risk/se may outweigh benefits  3. Vitamin B12 deficiency  E53.8    recheck levels  4. Vitamin D deficiency  E55.9    recheck levels  5. Primary osteoarthritis of right knee  M17.11    still sx, but is walking and very active, not changing gait and no falls, conservative mgmt with tylenol as needed  6. Osteopenia, unspecified location  M85.80    supplementing calcium and vit D - recheck basic labs  7. Encounter for medication monitoring  Z51.81       I discussed the assessment and treatment plan with the patient. The patient was provided an opportunity to ask questions and all were answered. The patient agreed with the plan and  demonstrated an understanding of the instructions.  The patient was advised to call back or seek an in-person evaluation if the symptoms worsen or if the condition fails to improve as anticipated.  I provided 15 minutes of non-face-to-face time during this encounter.  Delsa Grana, PA-C 09/10/2010:00 AM

## 2019-10-26 ENCOUNTER — Other Ambulatory Visit: Payer: Self-pay | Admitting: Family Medicine

## 2019-10-26 DIAGNOSIS — F0391 Unspecified dementia with behavioral disturbance: Secondary | ICD-10-CM

## 2020-02-01 ENCOUNTER — Telehealth: Payer: Self-pay

## 2020-02-01 NOTE — Telephone Encounter (Signed)
Patient needs in office visit and be sure to bring form

## 2020-02-01 NOTE — Telephone Encounter (Signed)
appt scheduled for 02-15-20 and was informed to get paperwork from the assisted living. Would like to be seen sooner and I told her that we would put her on the cancellation list

## 2020-02-01 NOTE — Telephone Encounter (Signed)
Copied from [place] [id number]. Topic: General - Inquiry >> Feb 01, 2020 10:07 AM Greggory Keen D wrote: Reason for CRM:  Pt's sister [name] is seeking to put her sister in an assisted living facility and she needs to get an FL2 form completed.  She wants to know what she needs to do.  Does she need to bring patient in the office or can this be done virtually.  CB#  [phone]

## 2020-02-15 ENCOUNTER — Other Ambulatory Visit: Payer: Self-pay

## 2020-02-15 ENCOUNTER — Encounter: Payer: Self-pay | Admitting: Family Medicine

## 2020-02-15 ENCOUNTER — Ambulatory Visit (INDEPENDENT_AMBULATORY_CARE_PROVIDER_SITE_OTHER): Payer: Medicare Other | Admitting: Family Medicine

## 2020-02-15 VITALS — BP 166/100 | HR 99 | Temp 98.2°F | Resp 14 | Ht 61.0 in | Wt 116.2 lb

## 2020-02-15 DIAGNOSIS — Z9181 History of falling: Secondary | ICD-10-CM

## 2020-02-15 DIAGNOSIS — M8580 Other specified disorders of bone density and structure, unspecified site: Secondary | ICD-10-CM | POA: Diagnosis not present

## 2020-02-15 DIAGNOSIS — H409 Unspecified glaucoma: Secondary | ICD-10-CM

## 2020-02-15 DIAGNOSIS — M1711 Unilateral primary osteoarthritis, right knee: Secondary | ICD-10-CM

## 2020-02-15 DIAGNOSIS — I10 Essential (primary) hypertension: Secondary | ICD-10-CM

## 2020-02-15 DIAGNOSIS — E559 Vitamin D deficiency, unspecified: Secondary | ICD-10-CM

## 2020-02-15 DIAGNOSIS — R1310 Dysphagia, unspecified: Secondary | ICD-10-CM

## 2020-02-15 DIAGNOSIS — R4189 Other symptoms and signs involving cognitive functions and awareness: Secondary | ICD-10-CM

## 2020-02-15 DIAGNOSIS — F0391 Unspecified dementia with behavioral disturbance: Secondary | ICD-10-CM

## 2020-02-15 DIAGNOSIS — Z66 Do not resuscitate: Secondary | ICD-10-CM

## 2020-02-15 DIAGNOSIS — R269 Unspecified abnormalities of gait and mobility: Secondary | ICD-10-CM

## 2020-02-15 DIAGNOSIS — E782 Mixed hyperlipidemia: Secondary | ICD-10-CM

## 2020-02-15 DIAGNOSIS — R251 Tremor, unspecified: Secondary | ICD-10-CM

## 2020-02-15 NOTE — Progress Notes (Signed)
Patient ID: Pamela Fox, female    DOB: 1934-08-29, 86 y.o.   MRN: [id number]  PCP: Delsa Grana, PA-C  Chief Complaint  Patient presents with  . Paperwork    FL2 for  home placement    Subjective:   Pamela Fox is a 86 y.o. female, presents to clinic with CC of the following:  HPI  Patient presents for [facility] paperwork  First time meeting patient in person previously did a telephone visit last November with the assistance of her sister Pamela Fox:  HPI from Nov 2020: Dementia: Med refill on Aricept and Namenda, Namenda was started only few months ago when medications were initiated patient experienced diarrhea so medicines were held for about a week and when restarted the Aricept and Namenda were given to her at different times a day and she no longer had any diarrhea.  History given by patient's Sister Pamela Fox who is her caregiver and power of attorney, she reports that her mental status is at her baseline she has not seen any rapid cognitive decline.  She states that she is active walks a lot during the day, has a very good appetite.  She states that she is not sure if she is aware what is going on with her body but she does complain of right knee pain with history of known right knee osteoarthritis.    Patient is supplementing with vitamin B12, calcium and vitamin D.  She takes baby aspirin every other day.  They would not like to come in for labs for few months would like to get through some of the winter and holidays come in next year around February.  Patient is new to me I reviewed her chart she does have a DNR, looks that over the past couple years medications have been weaned off if able and lab work has been done about once a year  Current HPI:  Since November there has been significant changes in pt memory and cognitive function, she is more agitation more of the time,  Mildly combative  And uncooperative, her behavior and cognition are worse in the afternoon and  evening She is more shaky in the mornings and has a worsening slow shuffled gait. No weight loss, good appetite More disoriented, only knows who she is and not much else She is requiring food be prepared for her but she can feed herself Pt's sister is here to get her placed at a facility.  Current Pamela Fox and care giver/other family member take care of her in their home.  There was a past Home health eval through the pt's sisters company she works for was done - one home visit in the past 12-15 months and they did not do anything at that time.  Pts sister Pamela Fox has been POA since 2012 Hx of dementia for at least 10 years, eval they believe was through PCP here and no past neuro eval that they know of Pt is only currently taking namenda 5 mg and previously was on aricept 10 mg last visit but she can no longer swallow  HTN -  Pt was on lisinopril 5 mg with hx of low normal Bps we discussed trial of holding meds, pts sister reports when she is relaxed BP runs around  118/78 - 124/78 When pt is agitated or angry with sister BP goes higher 150/90's, today in clinic BP is elevated, but at home it usually is low and normal.  No change in her movement, exertions  or complaints since stopping BP meds since November,   Pt is on multiple eye drops for Glaucoma followed by Dr. Suezanne Jacquet in [facility]  Otherwise takes B12 supplement, her calcium and Vid D supplement, ASA 81 mg   Patient Active Problem List   Diagnosis Date Noted  . At high risk for falls 02/19/2020  . Cognitive changes 02/19/2020  . Tremor, unspecified 02/19/2020  . Glaucoma of both eyes 02/19/2020  . OA (osteoarthritis) of knee 12/12/2018  . Vitamin B12 deficiency 06/09/2018  . Do not resuscitate status 12/09/2017  . Osteopenia 10/26/2017  . Postmenopausal disorder 05/31/2017  . Thoracic kyphosis 05/31/2017  . Vitamin D deficiency 05/31/2017  . Medication monitoring encounter 05/27/2016  . BP (high blood pressure) 04/17/2015  .  Dementia ([place]) 04/17/2015  . Hyperlipidemia 04/17/2015  . Personal history of colon cancer 04/24/2013  . Personal history of breast cancer 04/24/2013      Current Outpatient Medications:  .  aspirin EC 81 MG tablet, Take 81 mg by mouth 3 (three) times a week. , Disp: , Rfl:  .  Calcium Carb-Cholecalciferol (CALCIUM-VITAMIN D) 500-200 MG-UNIT tablet, Take 1 tablet by mouth daily., Disp: , Rfl:  .  dorzolamide (TRUSOPT) 2 % ophthalmic solution, , Disp: , Rfl: 0 .  latanoprost (XALATAN) 0.005 % ophthalmic solution, , Disp: , Rfl:  .  memantine (NAMENDA) 5 MG tablet, Take 1 tablet (5 mg total) by mouth daily., Disp: 90 tablet, Rfl: 1 .  vitamin B-12 (CYANOCOBALAMIN) 100 MCG tablet, Take 100 mcg by mouth daily., Disp: , Rfl:  .  COMBIGAN 0.2-0.5 % ophthalmic solution, , Disp: , Rfl:  .  donepezil (ARICEPT) 10 MG tablet, TAKE 1 TABLET(10 MG) BY MOUTH AT BEDTIME (Patient not taking: Reported on 02/15/2020), Disp: 90 tablet, Rfl: 0 .  lisinopril (ZESTRIL) 5 MG tablet, Take 1 tablet (5 mg total) by mouth at bedtime. (Patient not taking: Reported on 02/15/2020), Disp: 90 tablet, Rfl: 0   No Known Allergies   Family History  Problem Relation Age of Onset  . Cancer Paternal Aunt        breast  . Hypertension Sister   . Hyperlipidemia Sister   . Hyperlipidemia Brother   . Hypertension Brother   . Hyperlipidemia Brother   . Hypertension Brother   . Breast cancer Neg Hx      Social History   Socioeconomic History  . Marital status: Single    Spouse name: Not on file  . Number of children: Not on file  . Years of education: Not on file  . Highest education level: Not on file  Occupational History  . Not on file  Tobacco Use  . Smoking status: Never Smoker  . Smokeless tobacco: Never Used  Substance and Sexual Activity  . Alcohol use: No    Alcohol/week: 0.0 standard drinks  . Drug use: No  . Sexual activity: Not Currently  Other Topics Concern  . Not on file  Social History  Narrative  . Not on file   Social Determinants of Health   Financial Resource Strain:   . Difficulty of Paying Living Expenses:   Food Insecurity:   . Worried About Charity fundraiser in the Last Year:   . Arboriculturist in the Last Year:   Transportation Needs:   . Film/video editor (Medical):   Marland Kitchen Lack of Transportation (Non-Medical):   Physical Activity:   . Days of Exercise per Week:   . Minutes of Exercise per  Session:   Stress:   . Feeling of Stress :   Social Connections:   . Frequency of Communication with Friends and Family:   . Frequency of Social Gatherings with Friends and Family:   . Attends Religious Services:   . Active Member of Clubs or Organizations:   . Attends Archivist Meetings:   Marland Kitchen Marital Status:   Intimate Partner Violence:   . Fear of Current or Ex-Partner:   . Emotionally Abused:   Marland Kitchen Physically Abused:   . Sexually Abused:     Chart Review Today: I personally reviewed active problem list, medication list, allergies, family history, social history, health maintenance, notes from last encounter, lab results, imaging with the patient/caregiver today.   Review of Systems 10 Systems reviewed and are negative for acute change except as noted in the HPI.     Objective:   Vitals:   02/15/20 1330  BP: (!) 166/100  Resp: 14  Temp: 98.2 F (36.8 C)  SpO2: 99%  Weight: 116 lb 3.2 oz (52.7 kg)  Height: 5\' 1"  (1.549 m)    Body mass index is 21.96 kg/m.  Physical Exam Vitals and nursing note reviewed.  Constitutional:      General: She is not in acute distress.    Appearance: She is normal weight. She is not ill-appearing or toxic-appearing.  HENT:     Head: Normocephalic and atraumatic.     Right Ear: External ear normal.     Left Ear: External ear normal.  Eyes:     General:        Right eye: No discharge.        Left eye: No discharge.     Conjunctiva/sclera: Conjunctivae normal.  Cardiovascular:     Rate and Rhythm:  Normal rate and regular rhythm.     Pulses: Normal pulses.     Heart sounds: Normal heart sounds.  Pulmonary:     Effort: Pulmonary effort is normal. No respiratory distress.     Breath sounds: Normal breath sounds. No stridor. No wheezing.  Abdominal:     General: Abdomen is flat. Bowel sounds are normal. There is no distension.     Palpations: Abdomen is soft. There is no mass.     Tenderness: There is no abdominal tenderness.  Musculoskeletal:     Comments: Right knee diffusely and mildly enlarged w/o effusion or erythema, no ttp   Skin:    General: Skin is warm and dry.     Capillary Refill: Capillary refill takes less than 2 seconds.  Neurological:     Mental Status: She is alert. She is disoriented and confused.     Motor: Tremor present.     Gait: Gait abnormal.     Comments: Pt oriented to person  Psychiatric:        Attention and Perception: She is inattentive.        Cognition and Memory: Cognition is impaired. Memory is impaired. She exhibits impaired recent memory and impaired remote memory.     Comments: Pleasantly confused elderly female       Results for orders placed or performed in visit on 12/12/18  CBC  Result Value Ref Range   WBC 5.2 3.8 - 10.8 Thousand/uL   RBC 4.73 3.80 - 5.10 Million/uL   Hemoglobin 12.8 11.7 - 15.5 g/dL   HCT 39.6 35.0 - 45.0 %   MCV 83.7 80.0 - 100.0 fL   MCH 27.1 27.0 - 33.0 pg   MCHC  32.3 32.0 - 36.0 g/dL   RDW 14.4 11.0 - 15.0 %   Platelets 204 140 - 400 Thousand/uL   MPV 11.4 7.5 - 12.5 fL  COMPLETE METABOLIC PANEL WITH GFR  Result Value Ref Range   Glucose, Bld 72 65 - 99 mg/dL   BUN 13 7 - 25 mg/dL   Creat 0.71 0.60 - 0.88 mg/dL   GFR, Est Non African American 79 > OR = 60 mL/min/1.7m2   GFR, Est African American 92 > OR = 60 mL/min/1.44m2   BUN/Creatinine Ratio NOT APPLICABLE 6 - 22 (calc)   Sodium 140 135 - 146 mmol/L   Potassium 4.0 3.5 - 5.3 mmol/L   Chloride 105 98 - 110 mmol/L   CO2 24 20 - 32 mmol/L    Calcium 9.2 8.6 - 10.4 mg/dL   Total Protein 6.7 6.1 - 8.1 g/dL   Albumin 3.9 3.6 - 5.1 g/dL   Globulin 2.8 1.9 - 3.7 g/dL (calc)   AG Ratio 1.4 1.0 - 2.5 (calc)   Total Bilirubin 0.4 0.2 - 1.2 mg/dL   Alkaline phosphatase (APISO) 60 37 - 153 U/L   AST 16 10 - 35 U/L   ALT 10 6 - 29 U/L  Lipid panel  Result Value Ref Range   Cholesterol 207 (H) <200 mg/dL   HDL 67 > OR = 50 mg/dL   Triglycerides 128 <150 mg/dL   LDL Cholesterol (Calc) 116 (H) mg/dL (calc)   Total CHOL/HDL Ratio 3.1 <5.0 (calc)   Non-HDL Cholesterol (Calc) 140 (H) <130 mg/dL (calc)        Assessment & Plan:      ICD-10-CM   1. Dementia with behavioral disturbance, unspecified dementia type ([place])  F03.91 Ambulatory referral to Neurology   many year hx of dementia, pt cared for at home by sister and family member, past poor compliance with meds, dementia seemed to worsed past 5-6 months  2. Osteopenia, unspecified location  M85.80    on Vit D and Calcium supplements, pt has worsening gait, discussed fall risks and prevention - at this time dispense with dexa etc  3. Mixed hyperlipidemia  E78.2    not currently on meds, eating healthy, no current tx needed due to life expectancy  4. Essential hypertension  I10    continue to monitor BP at home, and continue to hold lisinopril, nearing end of life, okay to stop meds, SBP120-160 okay by me  5. Vitamin D deficiency  E55.9    continue supplement  6. Primary osteoarthritis of right knee  M17.11    can appreciate the edema of right knee, pt is unaware of which knee is swollen and points to left knee, no erythema or effusion currently  7. Abnormality of gait and mobility  R26.9 Ambulatory referral to Neurology   suspect part of progressing dementia - can walk w/o walker, but short shuffled gait and steps  8. Dysphagia, unspecified type  R13.10 Ambulatory referral to Neurology   difficulty with larger pills, and she is only being fed soft solids and liquids, supplementing  with boost 2x a day, other 3 meals, good appetite  9. Tremor, unspecified  R25.1 Ambulatory referral to Neurology   B/L UE shaky and with tremor  10. Cognitive changes  R41.89 Ambulatory referral to Neurology  11. Do not resuscitate status  Z66   12. At high risk for falls  Z91.81    discussed prevention  13. Glaucoma of both eyes, unspecified glaucoma type  H40.9  Glaucoma followed by Dr. Suezanne Jacquet in [place], - multiple eye drops       FL2 forms completed with Pt's sister and with [facility] aid/other family member - note, med list, and diagnosis list will be attached and faxed to facility (card/info provided). Dx and meds were not reconciled for many conditions and needed chart review to clarify and update.    Time based billing - more than 30 min spent face to face with pt, sister and [facility] aid/caregiver and friend.  Pt has not been in person in clinic for over a year and was seen by multiple other providers in the past 15 months.  Pt new to me, first time meeting in person, last visit was virtual, and history and condition of pt has changed since that time.      Greater than 50% of this visit was spent in direct face-to-face counseling, obtaining history and physical, discussing and educating pt on treatment plan.  Total time of this visit was 50 min +.  Remainder of time involved but was not limited to reviewing chart (recent and pertinent OV notes and labs), documentation in EMR, and coordinating care and treatment plan, completing requested paperwork FL2 and additional documentation.   Delsa Grana, PA-C 02/15/20 1:41 PM

## 2020-02-19 ENCOUNTER — Telehealth: Payer: Self-pay

## 2020-02-19 ENCOUNTER — Encounter: Payer: Self-pay | Admitting: Family Medicine

## 2020-02-19 DIAGNOSIS — R4189 Other symptoms and signs involving cognitive functions and awareness: Secondary | ICD-10-CM | POA: Insufficient documentation

## 2020-02-19 DIAGNOSIS — Z9181 History of falling: Secondary | ICD-10-CM | POA: Insufficient documentation

## 2020-02-19 DIAGNOSIS — R251 Tremor, unspecified: Secondary | ICD-10-CM | POA: Insufficient documentation

## 2020-02-19 DIAGNOSIS — H409 Unspecified glaucoma: Secondary | ICD-10-CM | POA: Insufficient documentation

## 2020-02-19 NOTE — Telephone Encounter (Signed)
Yes - I do need you help to reconcile the meds on chart - specifically the eye drops - need the dosing etc, and then meds list updated and printed to attach to form and then it should be completed and able to fax to Nancy's preferred facility

## 2020-02-19 NOTE — Telephone Encounter (Signed)
Copied from [place] [id number]. Topic: General - Inquiry >> Feb 01, 2020 10:07 AM Greggory Keen D wrote: Reason for CRM:  Pt's sister [name] is seeking to put her sister in an assisted living facility and she needs to get an FL2 form completed.  She wants to know what she needs to do.  Does she need to bring patient in the office or can this be done virtually.  CB#  [id number] >> Feb 19, 2020  8:52 AM Myatt, Marland Kitchen wrote: Ilda Basset is wanting to know if pts FL 2 has been faxed >> Feb 19, 2020  8:27 AM Sharene Skeans wrote: [name] called back about the status of the [facility] form she needs/ Please call and advise asap   Please send the forms to ATTENTION: Winnifred Friar asap

## 2020-02-19 NOTE — Telephone Encounter (Signed)
Copied from [place] [id number]. Topic: General - Inquiry >> Feb 01, 2020 10:07 AM Greggory Keen D wrote: Reason for CRM:  Pt's sister [name] is seeking to put her sister in an assisted living facility and she needs to get an FL2 form completed.  She wants to know what she needs to do.  Does she need to bring patient in the office or can this be done virtually.  CB#  [id number] >> Feb 19, 2020  8:52 AM Myatt, Marland Kitchen wrote: Ilda Basset is wanting to know if pts FL 2 has been faxed >> Feb 19, 2020  8:27 AM Sharene Skeans wrote: [name] called back about the status of the [facility] form she needs/ Please call and advise asap   Please send the forms to ATTENTION: Winnifred Friar asap

## 2020-02-19 NOTE — Telephone Encounter (Signed)
Have you finished forms?

## 2020-02-20 NOTE — Telephone Encounter (Signed)
Pamela Fox called to check on status of paperwork and asked for a call to let her know it has been faxed [phone]

## 2020-02-20 NOTE — Telephone Encounter (Signed)
Pt notified, faxed in form

## 2020-02-20 NOTE — Telephone Encounter (Signed)
Pt's POA called to report that pt receives every eye drop medication twice daily (1 in the am, 1 in the pm). The only one that she only receives at night is latanoprost (XALATAN) 0.005 % ophthalmic solution   Best contact: [phone]

## 2020-02-20 NOTE — Telephone Encounter (Signed)
Left detailed vm °

## 2020-02-27 NOTE — Telephone Encounter (Signed)
Notified faxed to [employer]

## 2020-02-27 NOTE — Telephone Encounter (Signed)
Patient's sister called and would like for FL2 form to be sent to a different facility. Patient will now be going to [facility]. Fax is [phone], attention Carolan Shiver. Please advise.

## 2020-03-04 NOTE — Telephone Encounter (Signed)
Claiborne Billings, with [place], requesting to speak with CMA to discuss patients current medication list.   CB#[phone]

## 2020-03-05 ENCOUNTER — Emergency Department: Payer: Medicare Other

## 2020-03-05 ENCOUNTER — Other Ambulatory Visit: Payer: Self-pay

## 2020-03-05 ENCOUNTER — Telehealth: Payer: Self-pay

## 2020-03-05 ENCOUNTER — Other Ambulatory Visit: Payer: Self-pay | Admitting: Family Medicine

## 2020-03-05 ENCOUNTER — Telehealth: Payer: Self-pay | Admitting: Family Medicine

## 2020-03-05 ENCOUNTER — Emergency Department
Admission: EM | Admit: 2020-03-05 | Discharge: 2020-03-05 | Disposition: A | Discharge status: Skilled Nursing Facility | Payer: Medicare Other | Attending: Emergency Medicine | Admitting: Emergency Medicine

## 2020-03-05 DIAGNOSIS — Y939 Activity, unspecified: Secondary | ICD-10-CM | POA: Insufficient documentation

## 2020-03-05 DIAGNOSIS — F0390 Unspecified dementia without behavioral disturbance: Secondary | ICD-10-CM

## 2020-03-05 DIAGNOSIS — S80212A Abrasion, left knee, initial encounter: Secondary | ICD-10-CM | POA: Insufficient documentation

## 2020-03-05 DIAGNOSIS — Y92129 Unspecified place in nursing home as the place of occurrence of the external cause: Secondary | ICD-10-CM | POA: Diagnosis not present

## 2020-03-05 DIAGNOSIS — W19XXXA Unspecified fall, initial encounter: Secondary | ICD-10-CM | POA: Diagnosis not present

## 2020-03-05 DIAGNOSIS — Z853 Personal history of malignant neoplasm of breast: Secondary | ICD-10-CM | POA: Diagnosis not present

## 2020-03-05 DIAGNOSIS — Z79899 Other long term (current) drug therapy: Secondary | ICD-10-CM | POA: Insufficient documentation

## 2020-03-05 DIAGNOSIS — I10 Essential (primary) hypertension: Secondary | ICD-10-CM | POA: Insufficient documentation

## 2020-03-05 DIAGNOSIS — Y999 Unspecified external cause status: Secondary | ICD-10-CM | POA: Insufficient documentation

## 2020-03-05 DIAGNOSIS — S0990XA Unspecified injury of head, initial encounter: Secondary | ICD-10-CM | POA: Diagnosis present

## 2020-03-05 DIAGNOSIS — Z85038 Personal history of other malignant neoplasm of large intestine: Secondary | ICD-10-CM | POA: Insufficient documentation

## 2020-03-05 DIAGNOSIS — S0003XA Contusion of scalp, initial encounter: Secondary | ICD-10-CM | POA: Insufficient documentation

## 2020-03-05 LAB — CBC
HCT: 39.5 % (ref 36.0–46.0)
Hemoglobin: 12.6 g/dL (ref 12.0–15.0)
MCH: 26.4 pg (ref 26.0–34.0)
MCHC: 31.9 g/dL (ref 30.0–36.0)
MCV: 82.8 fL (ref 80.0–100.0)
Platelets: 175 10*3/uL (ref 150–400)
RBC: 4.77 MIL/uL (ref 3.87–5.11)
RDW: 15.1 % (ref 11.5–15.5)
WBC: 7.2 10*3/uL (ref 4.0–10.5)
nRBC: 0 % (ref 0.0–0.2)

## 2020-03-05 LAB — COMPREHENSIVE METABOLIC PANEL
ALT: 14 U/L (ref 0–44)
AST: 20 U/L (ref 15–41)
Albumin: 4.4 g/dL (ref 3.5–5.0)
Alkaline Phosphatase: 69 U/L (ref 38–126)
Anion gap: 11 (ref 5–15)
BUN: 26 mg/dL — ABNORMAL HIGH (ref 8–23)
CO2: 26 mmol/L (ref 22–32)
Calcium: 9.6 mg/dL (ref 8.9–10.3)
Chloride: 108 mmol/L (ref 98–111)
Creatinine, Ser: 0.86 mg/dL (ref 0.44–1.00)
GFR calc Af Amer: >60 mL/min (ref >60)
GFR calc non Af Amer: >60 mL/min (ref >60)
Glucose, Bld: 101 mg/dL — ABNORMAL HIGH (ref 70–99)
Potassium: 3.8 mmol/L (ref 3.5–5.1)
Sodium: 145 mmol/L (ref 135–145)
Total Bilirubin: 0.8 mg/dL (ref 0.3–1.2)
Total Protein: 7.9 g/dL (ref 6.5–8.1)

## 2020-03-05 LAB — TROPONIN I (HIGH SENSITIVITY): Troponin I (High Sensitivity): 9 ng/L (ref <18)

## 2020-03-05 NOTE — ED Triage Notes (Signed)
Pt in via EMS from [facility] with c/o fall. EMS reports pt is very demented and keeps trying to get up out of chair.

## 2020-03-05 NOTE — Telephone Encounter (Signed)
[place] missed phone call, from nurse, and trying to get back in contact regarding medication clarification before 2pm.

## 2020-03-05 NOTE — ED Notes (Signed)
Pt unable to sign for discharge due to mental status. Pt's sister verbalizes d/c teaching as well as nursing at [employer]. Pt d/c with EMS transport.

## 2020-03-05 NOTE — Telephone Encounter (Signed)
Copied from [place] [id number]. Topic: General - Other >> Mar 05, 2020  2:22 PM Alanda Slim E wrote: Reason for CRM: Claiborne Billings called and stated that Pt has fallen and has a knot on her head and she is being taken to hospital by EMS

## 2020-03-05 NOTE — ED Triage Notes (Signed)
Pt unable to answer questions. Pt was found in the floor. Pt has an abrasion with swelling to the left side of the forehead.

## 2020-03-05 NOTE — ED Notes (Signed)
Pt's sister/POA [name] at bedside.

## 2020-03-05 NOTE — ED Notes (Signed)
Provided update to pt's sister. Told her report was given to [facility] and that EMS was being contacted to trx pt back to [facility].

## 2020-03-05 NOTE — Telephone Encounter (Signed)
[place] called and stated patient arrived yesterday and they need clarification on giving medicine. She was returning [place] phone call. Rep from [facility] states orders have to be in by 2pm.

## 2020-03-05 NOTE — ED Notes (Signed)
Pt trx to CT.  

## 2020-03-05 NOTE — ED Notes (Signed)
Provided [place] sandwich box to pt. Gave to pt's sister to assist w/ feeding.

## 2020-03-05 NOTE — ED Provider Notes (Signed)
[facility] Emergency Department Provider Note   ____________________________________________   First MD Initiated Contact with Patient 03/05/20 1721     (approximate)  I have reviewed the triage vital signs and the nursing notes.   HISTORY  Chief Complaint Fall   HPI Pamela Fox is a 86 y.o. female with past medical history of dementia, breast cancer, and hypertension who presents to the ED following fall.  History is limited due to patient's baseline dementia.  Patient reportedly had unwitnessed fall at [place], was found down on the ground but at her baseline mental status.  Abrasion and swelling noted to the left side of her scalp, patient currently denies any pain.  Her sister and medical POA are at the bedside and states she is acting at her usual baseline, also notes an abrasion to her left knee.        Past Medical History:  Diagnosis Date  . Breast cancer [facility]) 2007   Right, radiation treatment [facility]  . Colon cancer ([place]) 2009  . Colon polyps   . Hypertension 1990  . Osteopenia 10/26/2017   Next DEXA due Jan 2021  . Personal history of radiation therapy     Patient Active Problem List   Diagnosis Date Noted  . At high risk for falls 02/19/2020  . Cognitive changes 02/19/2020  . Tremor, unspecified 02/19/2020  . Glaucoma of both eyes 02/19/2020  . OA (osteoarthritis) of knee 12/12/2018  . Vitamin B12 deficiency 06/09/2018  . Do not resuscitate status 12/09/2017  . Osteopenia 10/26/2017  . Postmenopausal disorder 05/31/2017  . Thoracic kyphosis 05/31/2017  . Vitamin D deficiency 05/31/2017  . Medication monitoring encounter 05/27/2016  . BP (high blood pressure) 04/17/2015  . Dementia ([place]) 04/17/2015  . Hyperlipidemia 04/17/2015  . Personal history of colon cancer 04/24/2013  . Personal history of breast cancer 04/24/2013    Past Surgical History:  Procedure Laterality Date  . ABDOMINAL HYSTERECTOMY    . BREAST  BIOPSY  1997  . BREAST BIOPSY Right 2007   Stereo, positive  . BREAST SURGERY Right 2007   lumpectomy  . COLECTOMY  2009   colectomy for polyp wuth in-situ [place]  . COLONOSCOPY  [id number]   polyps  . salpingo oophorectmy      Prior to Admission medications   Medication Sig Start Date End Date Taking? Authorizing Provider  COMBIGAN 0.2-0.5 % ophthalmic solution Place 1 drop into both eyes every 12 (twelve) hours.  05/08/14   [provider]  dorzolamide (TRUSOPT) 2 % ophthalmic solution Place 1 drop into both eyes 2 (two) times daily.  09/29/16   [provider]  memantine (NAMENDA) 5 MG tablet Take 1 tablet (5 mg total) by mouth daily. 09/11/19   Delsa Grana, PA-C    Allergies Patient has no known allergies.  Family History  Problem Relation Age of Onset  . Cancer Paternal Aunt        breast  . Hypertension Sister   . Hyperlipidemia Sister   . Hyperlipidemia Brother   . Hypertension Brother   . Hyperlipidemia Brother   . Hypertension Brother   . Breast cancer Neg Hx     Social History Social History   Tobacco Use  . Smoking status: Never Smoker  . Smokeless tobacco: Never Used  Substance Use Topics  . Alcohol use: No    Alcohol/week: 0.0 standard drinks  . Drug use: No    Review of Systems Unable to obtain secondary to  dementia ____________________________________________   PHYSICAL EXAM:  VITAL SIGNS: ED Triage Vitals  Enc Vitals Group     BP 03/05/20 1515 (!) 169/105     Pulse Rate 03/05/20 1515 (!) 113     Resp 03/05/20 1515 18     Temp 03/05/20 1515 98.5 F (36.9 C)     Temp Source 03/05/20 1515 Oral     SpO2 03/05/20 1515 96 %     Weight 03/05/20 1516 116 lb 2.9 oz (52.7 kg)     Height 03/05/20 1516 5\' 1"  (1.549 m)     Head Circumference --      Peak Flow --      Pain Score --      Pain Loc --      Pain Edu? --      Excl. in [place]? --     Constitutional: Awake and alert. Eyes: Conjunctivae are normal. Head: Abrasion and  small hematoma noted to left frontal scalp.  No step-offs noted. Nose: No congestion/rhinnorhea. Mouth/Throat: Mucous membranes are moist. Neck: Midline cervical spine tenderness noted. Cardiovascular: Normal rate, regular rhythm. Grossly normal heart sounds. Respiratory: Normal respiratory effort.  No retractions. Lungs CTAB. Gastrointestinal: Soft and nontender. No distention. Genitourinary: deferred Musculoskeletal: No lower extremity tenderness nor edema.  Abrasion to left knee noted. Neurologic:  Normal speech and language.  Moving all extremities equally. Skin:  Skin is warm, dry and intact. No rash noted. Psychiatric: Mood and affect are normal. Speech and behavior are normal.  ____________________________________________   LABS (all labs ordered are listed, but only abnormal results are displayed)  Labs Reviewed  COMPREHENSIVE METABOLIC PANEL - Abnormal; Notable for the following components:      Result Value   Glucose, Bld 101 (*)    BUN 26 (*)    All other components within normal limits  CBC  TROPONIN I (HIGH SENSITIVITY)   ____________________________________________  EKG  ED ECG REPORT I, Blake Divine, the attending physician, personally viewed and interpreted this ECG.   Date: 03/05/2020  EKG Time: 15:20  Rate: 107  Rhythm: sinus tachycardia  Axis: Normal  Intervals:right bundle branch block  ST&T Change: None   PROCEDURES  Procedure(s) performed (including Critical Care):  Procedures   ____________________________________________   INITIAL IMPRESSION / ASSESSMENT AND PLAN / ED COURSE       86 year old female with history of dementia presents to the ED following unwitnessed fall at [employer].  She is unable to provide any history, but appears at her baseline mental status with no focal neurologic deficits.  CT head is negative for acute process, however patient does seem to have some midline cervical spine tenderness and we will also check  CT C-spine.  She is moving all her extremities equally, but does have abrasion to her left knee, will check x-ray.  No tenderness noted to her chest or abdomen.  EKG and lab work are unremarkable.  If remainder of imaging is negative, she would be appropriate for discharge back to [place].  CT C-spine is negative for acute process, x-rays of legs from ER also negative. No apparent traumatic injuries following fall and patient is appropriate for discharge back to [place].      ____________________________________________   FINAL CLINICAL IMPRESSION(S) / ED DIAGNOSES  Final diagnoses:  Fall, initial encounter  Contusion of scalp, initial encounter  Dementia without behavioral disturbance, unspecified dementia type [facility])     ED Discharge Orders    None  Note:  This document was prepared using Dragon voice recognition software and may include unintentional dictation errors.   Blake Divine, MD 03/05/20 [phone]

## 2020-03-05 NOTE — Telephone Encounter (Signed)
Updated med list and faxed back

## 2020-03-05 NOTE — Progress Notes (Signed)
Letter with current med list printed - will be attatched to prior FL2 paperwork - the facility would not accept the med list which showed the recently discontinued medications although it specifically states they are discontinued meds  Delsa Grana, PA-C

## 2020-03-05 NOTE — Telephone Encounter (Signed)
FYI

## 2020-03-05 NOTE — ED Notes (Signed)
[facility]  CALLED  FOR  TRANSPORT   TO  [facility] 

## 2020-03-16 ENCOUNTER — Emergency Department: Payer: Medicare Other

## 2020-03-16 ENCOUNTER — Other Ambulatory Visit: Payer: Self-pay

## 2020-03-16 ENCOUNTER — Encounter: Payer: Self-pay | Admitting: Emergency Medicine

## 2020-03-16 ENCOUNTER — Inpatient Hospital Stay
Admission: EM | Admit: 2020-03-16 | Discharge: 2020-03-20 | DRG: 871 | Disposition: A | Discharge status: Skilled Nursing Facility | Payer: Medicare Other | Attending: Internal Medicine | Admitting: Internal Medicine

## 2020-03-16 DIAGNOSIS — M810 Age-related osteoporosis without current pathological fracture: Secondary | ICD-10-CM | POA: Diagnosis present

## 2020-03-16 DIAGNOSIS — E878 Other disorders of electrolyte and fluid balance, not elsewhere classified: Secondary | ICD-10-CM | POA: Diagnosis present

## 2020-03-16 DIAGNOSIS — R945 Abnormal results of liver function studies: Secondary | ICD-10-CM | POA: Diagnosis present

## 2020-03-16 DIAGNOSIS — E860 Dehydration: Secondary | ICD-10-CM | POA: Diagnosis present

## 2020-03-16 DIAGNOSIS — G9341 Metabolic encephalopathy: Secondary | ICD-10-CM | POA: Diagnosis present

## 2020-03-16 DIAGNOSIS — Z66 Do not resuscitate: Secondary | ICD-10-CM | POA: Diagnosis present

## 2020-03-16 DIAGNOSIS — E872 Acidosis, unspecified: Secondary | ICD-10-CM

## 2020-03-16 DIAGNOSIS — R4182 Altered mental status, unspecified: Secondary | ICD-10-CM

## 2020-03-16 DIAGNOSIS — N170 Acute kidney failure with tubular necrosis: Secondary | ICD-10-CM

## 2020-03-16 DIAGNOSIS — N179 Acute kidney failure, unspecified: Secondary | ICD-10-CM | POA: Diagnosis present

## 2020-03-16 DIAGNOSIS — Z853 Personal history of malignant neoplasm of breast: Secondary | ICD-10-CM

## 2020-03-16 DIAGNOSIS — Z20822 Contact with and (suspected) exposure to covid-19: Secondary | ICD-10-CM | POA: Diagnosis present

## 2020-03-16 DIAGNOSIS — E870 Hyperosmolality and hypernatremia: Secondary | ICD-10-CM | POA: Diagnosis present

## 2020-03-16 DIAGNOSIS — G92 Toxic encephalopathy: Secondary | ICD-10-CM | POA: Diagnosis not present

## 2020-03-16 DIAGNOSIS — F0390 Unspecified dementia without behavioral disturbance: Secondary | ICD-10-CM | POA: Diagnosis present

## 2020-03-16 DIAGNOSIS — A419 Sepsis, unspecified organism: Secondary | ICD-10-CM | POA: Diagnosis present

## 2020-03-16 DIAGNOSIS — R7401 Elevation of levels of liver transaminase levels: Secondary | ICD-10-CM | POA: Diagnosis present

## 2020-03-16 DIAGNOSIS — I10 Essential (primary) hypertension: Secondary | ICD-10-CM | POA: Diagnosis present

## 2020-03-16 DIAGNOSIS — G309 Alzheimer's disease, unspecified: Secondary | ICD-10-CM

## 2020-03-16 DIAGNOSIS — Z85038 Personal history of other malignant neoplasm of large intestine: Secondary | ICD-10-CM

## 2020-03-16 DIAGNOSIS — R6520 Severe sepsis without septic shock: Secondary | ICD-10-CM | POA: Diagnosis present

## 2020-03-16 DIAGNOSIS — N390 Urinary tract infection, site not specified: Secondary | ICD-10-CM | POA: Diagnosis present

## 2020-03-16 DIAGNOSIS — L8990 Pressure ulcer of unspecified site, unspecified stage: Secondary | ICD-10-CM | POA: Insufficient documentation

## 2020-03-16 DIAGNOSIS — N3000 Acute cystitis without hematuria: Secondary | ICD-10-CM

## 2020-03-16 DIAGNOSIS — R7989 Other specified abnormal findings of blood chemistry: Secondary | ICD-10-CM | POA: Diagnosis present

## 2020-03-16 DIAGNOSIS — L89151 Pressure ulcer of sacral region, stage 1: Secondary | ICD-10-CM | POA: Diagnosis present

## 2020-03-16 DIAGNOSIS — E876 Hypokalemia: Secondary | ICD-10-CM | POA: Diagnosis not present

## 2020-03-16 DIAGNOSIS — F0280 Dementia in other diseases classified elsewhere without behavioral disturbance: Secondary | ICD-10-CM

## 2020-03-16 HISTORY — DX: Unspecified dementia, unspecified severity, without behavioral disturbance, psychotic disturbance, mood disturbance, and anxiety: F03.90

## 2020-03-16 LAB — LACTIC ACID, PLASMA
Lactic Acid, Venous: 7.5 mmol/L (ref 0.5–1.9)
Lactic Acid, Venous: 5.8 mmol/L (ref 0.5–1.9)

## 2020-03-16 LAB — CBC WITH DIFFERENTIAL/PLATELET
Abs Immature Granulocytes: 0.3 10*3/uL — ABNORMAL HIGH (ref 0.00–0.07)
Basophils Absolute: 0.1 10*3/uL (ref 0.0–0.1)
Basophils Relative: 1 %
Eosinophils Absolute: 0 10*3/uL (ref 0.0–0.5)
Eosinophils Relative: 0 %
HCT: 49.3 % — ABNORMAL HIGH (ref 36.0–46.0)
Hemoglobin: 15.5 g/dL — ABNORMAL HIGH (ref 12.0–15.0)
Immature Granulocytes: 2 %
Lymphocytes Relative: 9 %
Lymphs Abs: 1.1 10*3/uL (ref 0.7–4.0)
MCH: 27 pg (ref 26.0–34.0)
MCHC: 31.4 g/dL (ref 30.0–36.0)
MCV: 85.7 fL (ref 80.0–100.0)
Monocytes Absolute: 1.1 10*3/uL — ABNORMAL HIGH (ref 0.1–1.0)
Monocytes Relative: 8 %
Neutro Abs: 10.3 10*3/uL — ABNORMAL HIGH (ref 1.7–7.7)
Neutrophils Relative %: 80 %
Platelets: 139 10*3/uL — ABNORMAL LOW (ref 150–400)
RBC: 5.75 MIL/uL — ABNORMAL HIGH (ref 3.87–5.11)
RDW: 15 % (ref 11.5–15.5)
Smear Review: DECREASED
WBC: 12.9 10*3/uL — ABNORMAL HIGH (ref 4.0–10.5)
nRBC: 0 % (ref 0.0–0.2)

## 2020-03-16 LAB — URINALYSIS, ROUTINE W REFLEX MICROSCOPIC
Bilirubin Urine: NEGATIVE
Glucose, UA: NEGATIVE mg/dL
Ketones, ur: 5 mg/dL — AB
Nitrite: NEGATIVE
Protein, ur: 100 mg/dL — AB
Specific Gravity, Urine: 1.023 (ref 1.005–1.030)
WBC, UA: >50 WBC/hpf — ABNORMAL HIGH (ref 0–5)
pH: 5 (ref 5.0–8.0)

## 2020-03-16 LAB — COMPREHENSIVE METABOLIC PANEL
ALT: 177 U/L — ABNORMAL HIGH (ref 0–44)
AST: 270 U/L — ABNORMAL HIGH (ref 15–41)
Albumin: 3.5 g/dL (ref 3.5–5.0)
Alkaline Phosphatase: 79 U/L (ref 38–126)
Anion gap: 19 — ABNORMAL HIGH (ref 5–15)
BUN: 81 mg/dL — ABNORMAL HIGH (ref 8–23)
CO2: 26 mmol/L (ref 22–32)
Calcium: 9.2 mg/dL (ref 8.9–10.3)
Chloride: 111 mmol/L (ref 98–111)
Creatinine, Ser: 2.53 mg/dL — ABNORMAL HIGH (ref 0.44–1.00)
GFR calc Af Amer: 20 mL/min — ABNORMAL LOW (ref >60)
GFR calc non Af Amer: 17 mL/min — ABNORMAL LOW (ref >60)
Glucose, Bld: 124 mg/dL — ABNORMAL HIGH (ref 70–99)
Potassium: 4.1 mmol/L (ref 3.5–5.1)
Sodium: 156 mmol/L — ABNORMAL HIGH (ref 135–145)
Total Bilirubin: 1.2 mg/dL (ref 0.3–1.2)
Total Protein: 7.9 g/dL (ref 6.5–8.1)

## 2020-03-16 LAB — PROTIME-INR
INR: 1.3 — ABNORMAL HIGH (ref 0.8–1.2)
Prothrombin Time: 16.1 seconds — ABNORMAL HIGH (ref 11.4–15.2)

## 2020-03-16 LAB — SARS CORONAVIRUS 2 BY RT PCR (HOSPITAL ORDER, PERFORMED IN ~~LOC~~ HOSPITAL LAB): SARS Coronavirus 2: NEGATIVE

## 2020-03-16 MED ORDER — SODIUM CHLORIDE 0.9 % IV SOLN: Freq: Once | INTRAVENOUS | Status: AC

## 2020-03-16 MED ORDER — SODIUM CHLORIDE 0.9 % IV SOLN: Freq: Once | INTRAVENOUS | Status: DC

## 2020-03-16 MED ORDER — ONDANSETRON HCL 4 MG/2ML IJ SOLN: 4.0000 mg | Freq: Four times a day (QID) | INTRAMUSCULAR | Status: DC | PRN

## 2020-03-16 MED ORDER — HEPARIN SODIUM (PORCINE) 5000 UNIT/ML IJ SOLN
5000.0000 [IU] | Freq: Three times a day (TID) | INTRAMUSCULAR | Status: DC
  Administered 2020-03-16 – 2020-03-20 (×11): 5000 [IU] via SUBCUTANEOUS
  Filled 2020-03-16 (×12): qty 1

## 2020-03-16 MED ORDER — ONDANSETRON HCL 4 MG PO TABS: 4.0000 mg | ORAL_TABLET | Freq: Four times a day (QID) | ORAL | Status: DC | PRN

## 2020-03-16 MED ORDER — ACETAMINOPHEN 325 MG PO TABS: 650.0000 mg | ORAL_TABLET | Freq: Four times a day (QID) | ORAL | Status: DC | PRN

## 2020-03-16 MED ORDER — VANCOMYCIN HCL IN DEXTROSE 1-5 GM/200ML-% IV SOLN
1000.0000 mg | Freq: Once | INTRAVENOUS | Status: AC
  Administered 2020-03-16: 1000 mg via INTRAVENOUS
  Filled 2020-03-16: qty 200

## 2020-03-16 MED ORDER — PIPERACILLIN-TAZOBACTAM 3.375 G IVPB 30 MIN
3.3750 g | Freq: Once | INTRAVENOUS | Status: AC
  Administered 2020-03-16: 3.375 g via INTRAVENOUS
  Filled 2020-03-16: qty 50

## 2020-03-16 MED ORDER — HYDROCODONE-ACETAMINOPHEN 5-325 MG PO TABS: 1.0000 | ORAL_TABLET | ORAL | Status: DC | PRN

## 2020-03-16 MED ORDER — SODIUM CHLORIDE 0.9 % IV SOLN
1.0000 g | INTRAVENOUS | Status: DC
  Administered 2020-03-18: 1 g via INTRAVENOUS
  Filled 2020-03-16 (×2): qty 1

## 2020-03-16 MED ORDER — SODIUM CHLORIDE 0.9 % IV SOLN: INTRAVENOUS | Status: DC

## 2020-03-16 MED ORDER — LACTATED RINGERS IV BOLUS
1000.0000 mL | Freq: Once | INTRAVENOUS | Status: AC
  Administered 2020-03-16: 1000 mL via INTRAVENOUS

## 2020-03-16 MED ORDER — ACETAMINOPHEN 650 MG RE SUPP: 650.0000 mg | Freq: Four times a day (QID) | RECTAL | Status: DC | PRN

## 2020-03-16 MED ORDER — SODIUM CHLORIDE 0.9 % IV SOLN
2.0000 g | Freq: Once | INTRAVENOUS | Status: AC
  Administered 2020-03-16: 2 g via INTRAVENOUS
  Filled 2020-03-16: qty 2

## 2020-03-16 NOTE — H&P (Signed)
History and Physical   Pamela Fox [id number] DOB: 04-Jul-1934 DOA: 03/16/2020  Referring MD/NP/PA: Dr. Jimmye Norman  PCP: Delsa Grana, PA-C   Outpatient Specialists: None  Patient coming from: From skilled facility  Chief Complaint: Altered mental status  HPI: Pamela Fox is a 86 y.o. female with medical history significant of dementia, osteoporosis, history of breast and colon cancer, hypertension who was brought in from skilled facility with altered mental status.  Sister at bedside states she visited this morning and patient was confused and altered.  She has been shaking and appears to be in chills.  Patient brought to the emergency room where she is at the moment not able to communicate.  She has continuous tremors and chills.  She has history of tremors which is chronic.  She appears to have sepsis by criteria and urinalysis is grossly positive.  Suspected to have sepsis due to UTI and patient is being admitted to the hospital for evaluation and treatment.  She is unable to communicate at this point.  She is a DNR.Marland Kitchen  ED Course: Temperature 97.9 blood pressure 160/139 pulse 122 respiratory 25 oxygen sat 98% on 2 L.  Her mews score is 5.  White count 12.9 hemoglobin 13.5 and platelet count of 139.  Sodium 156 potassium four-point chloride 111 CO2 26 BUN 81 creatinine 2.53 calcium 9.2.  INR 1.3 glucose 124 and lactic acid 7.2.  Urinalysis shows significant bleed clouded large leukocyte WBC more than 50 with some bacteria.  COVID-19 screen is negative.  His CT without contrast is negative.  Chest x-ray shows no acute cardiopulmonary process.  Patient therefore is being admitted to the hospital for further evaluation and treatment  Review of Systems: As per HPI otherwise 10 point review of systems negative.    Past Medical History:  Diagnosis Date  . Dementia ([place])     History reviewed. No pertinent surgical history.   reports that she has never smoked. She has never used  smokeless tobacco. She reports previous alcohol use. She reports previous drug use.  No Known Allergies  History reviewed. No pertinent family history.   Prior to Admission medications   Not on File    Physical Exam: Vitals:   03/16/20 1550 03/16/20 1554 03/16/20 1730 03/16/20 1836  BP:  98/77 (!) 160/139 104/83  Pulse:  (!) 122 (!) 118 (!) 116  Resp:  18 (!) 25 20  Temp:  97.9 F (36.6 C)    TempSrc:  Oral    SpO2:  100% 98% 99%  Weight: 63 kg     Height: 5\' 3"  (1.6 m)         Constitutional: Obtunded, unresponsive Vitals:   03/16/20 1550 03/16/20 1554 03/16/20 1730 03/16/20 1836  BP:  98/77 (!) 160/139 104/83  Pulse:  (!) 122 (!) 118 (!) 116  Resp:  18 (!) 25 20  Temp:  97.9 F (36.6 C)    TempSrc:  Oral    SpO2:  100% 98% 99%  Weight: 63 kg     Height: 5\' 3"  (1.6 m)      Eyes: PERRL, lids and conjunctivae normal ENMT: Mucous membranes are moist. Posterior pharynx clear of any exudate or lesions.Normal dentition.  Neck: normal, supple, no masses, no thyromegaly Respiratory: clear to auscultation bilaterally, no wheezing, no crackles. Normal respiratory effort. No accessory muscle use.  Cardiovascular: Sinus tachycardia no murmurs / rubs / gallops. No extremity edema. 2+ pedal pulses. No carotid bruits.  Abdomen: no tenderness, no masses palpated. No  hepatosplenomegaly. Bowel sounds positive.  Musculoskeletal: no clubbing / cyanosis. No joint deformity upper and lower extremities. Good ROM, no contractures. Normal muscle tone.  Skin: no rashes, lesions, ulcers. No induration Neurologic: CN 2-12 grossly intact. Sensation intact, DTR normal. Strength 5/5 in all 4.  Confused, obtunded, tremors Psychiatric: Complete obtundation   Labs on Admission: I have personally reviewed following labs and imaging studies  CBC: Recent Labs  Lab 03/16/20 1617  WBC 12.9*  NEUTROABS 10.3*  HGB 15.5*  HCT 49.3*  MCV 85.7  PLT [id number]*   Basic Metabolic Panel: Recent Labs    Lab 03/16/20 1617  NA 156*  K 4.1  CL 111  CO2 26  GLUCOSE 124*  BUN 81*  CREATININE 2.53*  CALCIUM 9.2   GFR: Estimated Creatinine Clearance: 15.1 mL/min (A) (by C-G formula based on SCr of 2.53 mg/dL (H)). Liver Function Tests: Recent Labs  Lab 03/16/20 1617  AST 270*  ALT 177*  ALKPHOS 79  BILITOT 1.2  PROT 7.9  ALBUMIN 3.5   No results for input(s): LIPASE, AMYLASE in the last 168 hours. No results for input(s): AMMONIA in the last 168 hours. Coagulation Profile: Recent Labs  Lab 03/16/20 1617  INR 1.3*   Cardiac Enzymes: No results for input(s): CKTOTAL, CKMB, CKMBINDEX, TROPONINI in the last 168 hours. BNP (last 3 results) No results for input(s): PROBNP in the last 8760 hours. HbA1C: No results for input(s): HGBA1C in the last 72 hours. CBG: No results for input(s): GLUCAP in the last 168 hours. Lipid Profile: No results for input(s): CHOL, HDL, LDLCALC, TRIG, CHOLHDL, LDLDIRECT in the last 72 hours. Thyroid Function Tests: No results for input(s): TSH, T4TOTAL, FREET4, T3FREE, THYROIDAB in the last 72 hours. Anemia Panel: No results for input(s): VITAMINB12, FOLATE, FERRITIN, TIBC, IRON, RETICCTPCT in the last 72 hours. Urine analysis:    Component Value Date/Time   COLORURINE AMBER (A) 03/16/2020 1539   APPEARANCEUR CLOUDY (A) 03/16/2020 1539   LABSPEC 1.023 03/16/2020 1539   PHURINE 5.0 03/16/2020 1539   GLUCOSEU NEGATIVE 03/16/2020 1539   HGBUR LARGE (A) 03/16/2020 1539   BILIRUBINUR NEGATIVE 03/16/2020 1539   KETONESUR 5 (A) 03/16/2020 1539   PROTEINUR 100 (A) 03/16/2020 1539   NITRITE NEGATIVE 03/16/2020 1539   LEUKOCYTESUR LARGE (A) 03/16/2020 1539   Sepsis Labs: @LABRCNTIP (procalcitonin:4,lacticidven:4) ) Recent Results (from the past 240 hour(s))  SARS Coronavirus 2 by RT PCR (hospital order, performed in [place] hospital lab) Nasopharyngeal Nasopharyngeal Swab     Status: None   Collection Time: 03/16/20  4:17 PM   Specimen:  Nasopharyngeal Swab  Result Value Ref Range Status   SARS Coronavirus 2 NEGATIVE NEGATIVE Final    Comment: (NOTE) SARS-CoV-2 target nucleic acids are NOT DETECTED. The SARS-CoV-2 RNA is generally detectable in upper and lower respiratory specimens during the acute phase of infection. The lowest concentration of SARS-CoV-2 viral copies this assay can detect is 250 copies / mL. A negative result does not preclude SARS-CoV-2 infection and should not be used as the sole basis for treatment or other patient management decisions.  A negative result may occur with improper specimen collection / handling, submission of specimen other than nasopharyngeal swab, presence of viral mutation(s) within the areas targeted by this assay, and inadequate number of viral copies (<250 copies / mL). A negative result must be combined with clinical observations, patient history, and epidemiological information. Fact Sheet for Patients:   StrictlyIdeas.no Fact Sheet for Healthcare Providers: BankingDealers.co.za This test is not  yet approved or cleared  by the [place] and has been authorized for detection and/or diagnosis of SARS-CoV-2 by FDA under an Emergency Use Authorization (EUA).  This EUA will remain in effect (meaning this test can be used) for the duration of the COVID-19 declaration under Section 564(b)(1) of the Act, 21 U.S.C. section 360bbb-3(b)(1), unless the authorization is terminated or revoked sooner. Performed at [facility], [address], [place], [place] [address]      Radiological Exams on Admission: CT Head Wo Contrast  Result Date: 03/16/2020 CLINICAL DATA:  Encephalopathy.  The client in mental status. EXAM: CT HEAD WITHOUT CONTRAST TECHNIQUE: Contiguous axial images were obtained from the base of the skull through the vertex without intravenous contrast. COMPARISON:  Mar 05, 2020 FINDINGS: Brain: No subdural,  epidural, or subarachnoid hemorrhage. Cerebellum, brainstem, and basal cisterns are normal. Ventricles and sulci remain prominent but stable. White matter changes are unchanged. No acute cortical ischemia or infarct. No mass effect or midline shift. Vascular: Calcified atherosclerosis is seen in the intracranial carotids. Skull: Normal. Negative for fracture or focal lesion. Sinuses/Orbits: No acute finding. Other: None. IMPRESSION: 1. Chronic white matter changes. No acute intracranial abnormalities. Electronically Signed   By: Dorise Bullion III M.D   On: 03/16/2020 18:11   DG Chest Port 1 View  Result Date: 03/16/2020 CLINICAL DATA:  Altered mental status. EXAM: PORTABLE CHEST 1 VIEW COMPARISON:  None. FINDINGS: The cardiomediastinal contours are within normal limits. The lungs are clear. No pneumothorax or significant pleural effusion. There is no acute finding in the visualized skeleton. IMPRESSION: No acute cardiopulmonary process. Electronically Signed   By: Audie Pinto M.D.   On: 03/16/2020 16:11    EKG: Independently reviewed.  It shows sinus tachycardia with right bundle branch block.  No acute findings.  Assessment/Plan Principal Problem:   Sepsis ([place]) Active Problems:   Hypernatremia   ARF (acute renal failure) (HCC)   Dehydration   LFTs abnormal   Dementia (HCC)   UTI (urinary tract infection)     #1 sepsis with organ damage: Secondary to UTI.  Patient currently obtunded.  Blood and urine cultures obtained.  Fluid boluses.  Follow the sepsis protocol and await culture and sensitivity results.  #2 UTI: Await urine culture as above on continuous treatment  #3 acute renal failure: Prerenal most likely.  Aggressive hydration and monitor.  #4 severe dehydration: As per above.  Continue monitoring.  #5 dementia: Advanced but worsened now with acute illness.  Resume home regimen when ready  #6 abnormal LFTs: Most likely due to sepsis.  Follow LFTs  #7 hypernatremia:  Probably secondary dehydration.  Once patient's volume is expanded may switch to D5.   DVT prophylaxis: Heparin Code Status: DNR Family Communication: Sister at bedside Disposition Plan: Back to SNF Consults called: None Admission status: Inpatient  Severity of Illness: The appropriate patient status for this patient is INPATIENT. Inpatient status is judged to be reasonable and necessary in order to provide the required intensity of service to ensure the patient's safety. The patient's presenting symptoms, physical exam findings, and initial radiographic and laboratory data in the context of their chronic comorbidities is felt to place them at high risk for further clinical deterioration. Furthermore, it is not anticipated that the patient will be medically stable for discharge from the hospital within 2 midnights of admission. The following factors support the patient status of inpatient.   " The patient's presenting symptoms include altered mental status. " The  worrisome physical exam findings include complete obtundation. " The initial radiographic and laboratory data are worrisome because of evidence of UTI and sepsis. " The chronic co-morbidities include dementia.   * I certify that at the point of admission it is my clinical judgment that the patient will require inpatient hospital care spanning beyond 2 midnights from the point of admission due to high intensity of service, high risk for further deterioration and high frequency of surveillance required.Barbette Merino MD [facility] Pager [phone]  If 7PM-7AM, please contact night-coverage [email or web address] Password [facility] [place]  03/16/2020, 7:27 PM

## 2020-03-16 NOTE — ED Provider Notes (Signed)
ER Provider Note       Time seen: 3:47 PM    I have reviewed the vital signs and the nursing notes.  HISTORY   Chief Complaint Code Sepsis and Unresponsive   HPI Pamela Fox is a 86 y.o. female with a history of dementia who presents today for altered mental status.  Reportedly she has had decreased mental status over the last week.  She is a full code.  Vital signs prehospital were unremarkable.  No further information is available.  No past medical history on file.  Allergies Patient has no allergy information on record.   Review of Systems Unknown, reported altered mental status  All systems negative/normal/unremarkable except as stated in the HPI  ____________________________________________   PHYSICAL EXAM:  VITAL SIGNS: There were no vitals filed for this visit.  Constitutional: Patient is lethargic and responds to painful stimuli.  Eyes: Conjunctivae are normal.  ENT      Head: Normocephalic, left periorbital ecchymosis      Nose: No congestion/rhinnorhea.      Mouth/Throat: Mucous membranes are moist.      Neck: No stridor. Cardiovascular: Rapid rate, regular rhythm. No murmurs, rubs, or gallops. Respiratory: Normal respiratory effort without tachypnea nor retractions. Breath sounds are clear and equal bilaterally. No wheezes/rales/rhonchi. Gastrointestinal: Soft and nontender. Normal bowel sounds Musculoskeletal: Limited range of motion of the extremities. Neurologic: Unable to assess, patient localizes to pain Skin:  Skin is warm, dry and intact. No rash noted. Psychiatric: Unable to assess ____________________________________________  EKG: Interpreted by me.  Sinus tachycardia with rate of 126 bpm, right bundle branch block, long QT  ____________________________________________   LABS (pertinent positives/negatives)  Labs Reviewed  LACTIC ACID, PLASMA - Abnormal; Notable for the following components:      Result Value   Lactic Acid,  Venous 7.5 (*)    All other components within normal limits  LACTIC ACID, PLASMA - Abnormal; Notable for the following components:   Lactic Acid, Venous 5.8 (*)    All other components within normal limits  COMPREHENSIVE METABOLIC PANEL - Abnormal; Notable for the following components:   Sodium 156 (*)    Glucose, Bld 124 (*)    BUN 81 (*)    Creatinine, Ser 2.53 (*)    AST 270 (*)    ALT 177 (*)    GFR calc non Af Amer 17 (*)    GFR calc Af Amer 20 (*)    Anion gap 19 (*)    All other components within normal limits  CBC WITH DIFFERENTIAL/PLATELET - Abnormal; Notable for the following components:   WBC 12.9 (*)    RBC 5.75 (*)    Hemoglobin 15.5 (*)    HCT 49.3 (*)    Platelets 139 (*)    Neutro Abs 10.3 (*)    Monocytes Absolute 1.1 (*)    Abs Immature Granulocytes 0.30 (*)    All other components within normal limits  PROTIME-INR - Abnormal; Notable for the following components:   Prothrombin Time 16.1 (*)    INR 1.3 (*)    All other components within normal limits  BLOOD GAS, VENOUS - Abnormal; Notable for the following components:   pCO2, Ven 61 (*)    All other components within normal limits  CULTURE, BLOOD (ROUTINE X 2)  CULTURE, BLOOD (ROUTINE X 2)  URINE CULTURE  SARS CORONAVIRUS 2 BY RT PCR (HOSPITAL ORDER, [facility])  URINALYSIS, ROUTINE W REFLEX MICROSCOPIC   CRITICAL  CARE Performed by: Laurence Aly   Total critical care time: 30 minutes  Critical care time was exclusive of separately billable procedures and treating other patients.  Critical care was necessary to treat or prevent imminent or life-threatening deterioration.  Critical care was time spent personally by me on the following activities: development of treatment plan with patient and/or surrogate as well as nursing, discussions with consultants, evaluation of patient's response to treatment, examination of patient, obtaining history from patient or surrogate,  ordering and performing treatments and interventions, ordering and review of laboratory studies, ordering and review of radiographic studies, pulse oximetry and re-evaluation of patient's condition.  RADIOLOGY  Images were viewed by me CT head, chest x-ray IMPRESSION: 1. Chronic white matter changes. No acute intracranial Abnormalities. IMPRESSION: No acute cardiopulmonary process.   DIFFERENTIAL DIAGNOSIS  Sepsis, dehydration, electrolyte abnormality, dementia, CVA, TIA, subarachnoid hemorrhage, subdural  ASSESSMENT AND PLAN  Altered mental status, acute renal failure, lactic acidosis, possible sepsis   Plan: The patient had presented for altered mental status. Patient's labs did reveal significant leukocytosis with a left shift, lactic acidosis and acute renal failure with a likely large free water deficit.  Initially she was given fluids and her mental status appears to be improving.  She has received broad-spectrum antibiotics as well and is possibly septic.  I will discuss with the hospitalist for admission.  Lenise Arena MD    Note: This note was generated in part or whole with voice recognition software. Voice recognition is usually quite accurate but there are transcription errors that can and very often do occur. I apologize for any typographical errors that were not detected and corrected.     Earleen Newport, MD 03/16/20 [place]

## 2020-03-16 NOTE — ED Triage Notes (Signed)
Patient arrived via EMS from [facility]. Facility called 911 due to patient status declining since last week on Tuesday. Family was aware and notified by facility however family did not want patient sent to emergency department. Patient is responsive only to painful stimuli. Unsure of what patient baseline cognition and ambulatory status is.

## 2020-03-16 NOTE — Consult Note (Signed)
Pharmacy Antibiotic Note  Pamela Fox is a 86 y.o. female admitted on 03/16/2020 with UTI  Pharmacy has been consulted for cefepime dosing.  Plan: Start Cefepime 1g IV every 24 hours   Height: 5\' 3"  (160 cm) Weight: 63 kg (138 lb 14.2 oz) IBW/kg (Calculated) : 52.4  Temp (24hrs), Avg:97.9 F (36.6 C), Min:97.9 F (36.6 C), Max:97.9 F (36.6 C)  Recent Labs  Lab 03/16/20 1617 03/16/20 1748  WBC 12.9*  --   CREATININE 2.53*  --   LATICACIDVEN 7.5* 5.8*    Estimated Creatinine Clearance: 15.1 mL/min (A) (by C-G formula based on SCr of 2.53 mg/dL (H)).    No Known Allergies  Antimicrobials this admission: 5/29 cefepime  Microbiology results: 5/29 BCx: pending 5/29 UCx: pending  Thank you for allowing pharmacy to be a part of this patient's care.  Pernell Dupre, PharmD, BCPS Clinical Pharmacist 03/16/2020 7:49 PM

## 2020-03-16 NOTE — ED Provider Notes (Signed)
Labs also resemble UTI so likely urosepsis for which she has received broad-spectrum antibiotics.   Earleen Newport, MD 03/16/20 1900

## 2020-03-16 NOTE — ED Notes (Signed)
Pt transported to CT at this time.

## 2020-03-16 NOTE — ED Notes (Signed)
Pt is here from [facility]. Sister at bedside. Sister states pt was admitted to [facility] on 03/04/2020 and then fell the next day. Sister states pt was able to walk in but is now unable to walk and unable to complete ADLs. Pt is resting in room. Cardiac, bp and pulse ox monitor on.  Pt noted to have brusing to left eye, left shoulder, right hip and bilateral knees.

## 2020-03-17 ENCOUNTER — Encounter: Payer: Self-pay | Admitting: Internal Medicine

## 2020-03-17 ENCOUNTER — Inpatient Hospital Stay: Payer: Medicare Other

## 2020-03-17 DIAGNOSIS — E860 Dehydration: Secondary | ICD-10-CM

## 2020-03-17 LAB — CBC
HCT: 36.3 % (ref 36.0–46.0)
HCT: 38.8 % (ref 36.0–46.0)
Hemoglobin: 11.5 g/dL — ABNORMAL LOW (ref 12.0–15.0)
Hemoglobin: 12.3 g/dL (ref 12.0–15.0)
MCH: 26.6 pg (ref 26.0–34.0)
MCH: 26.7 pg (ref 26.0–34.0)
MCHC: 31.7 g/dL (ref 30.0–36.0)
MCHC: 31.7 g/dL (ref 30.0–36.0)
MCV: 84 fL (ref 80.0–100.0)
MCV: 84.2 fL (ref 80.0–100.0)
Platelets: 105 10*3/uL — ABNORMAL LOW (ref 150–400)
Platelets: 99 10*3/uL — ABNORMAL LOW (ref 150–400)
RBC: 4.31 MIL/uL (ref 3.87–5.11)
RBC: 4.62 MIL/uL (ref 3.87–5.11)
RDW: 14.6 % (ref 11.5–15.5)
RDW: 14.6 % (ref 11.5–15.5)
WBC: 16.4 10*3/uL — ABNORMAL HIGH (ref 4.0–10.5)
WBC: 17.3 10*3/uL — ABNORMAL HIGH (ref 4.0–10.5)
nRBC: 0 % (ref 0.0–0.2)
nRBC: 0 % (ref 0.0–0.2)

## 2020-03-17 LAB — COMPREHENSIVE METABOLIC PANEL
ALT: 436 U/L — ABNORMAL HIGH (ref 0–44)
AST: 726 U/L — ABNORMAL HIGH (ref 15–41)
Albumin: 2.6 g/dL — ABNORMAL LOW (ref 3.5–5.0)
Alkaline Phosphatase: 54 U/L (ref 38–126)
Anion gap: 12 (ref 5–15)
BUN: 73 mg/dL — ABNORMAL HIGH (ref 8–23)
CO2: 25 mmol/L (ref 22–32)
Calcium: 8 mg/dL — ABNORMAL LOW (ref 8.9–10.3)
Chloride: 118 mmol/L — ABNORMAL HIGH (ref 98–111)
Creatinine, Ser: 1.75 mg/dL — ABNORMAL HIGH (ref 0.44–1.00)
GFR calc Af Amer: 31 mL/min — ABNORMAL LOW (ref >60)
GFR calc non Af Amer: 26 mL/min — ABNORMAL LOW (ref >60)
Glucose, Bld: 92 mg/dL (ref 70–99)
Potassium: 3.3 mmol/L — ABNORMAL LOW (ref 3.5–5.1)
Sodium: 155 mmol/L — ABNORMAL HIGH (ref 135–145)
Total Bilirubin: 1.2 mg/dL (ref 0.3–1.2)
Total Protein: 5.6 g/dL — ABNORMAL LOW (ref 6.5–8.1)

## 2020-03-17 LAB — CREATININE, SERUM
Creatinine, Ser: 2.2 mg/dL — ABNORMAL HIGH (ref 0.44–1.00)
GFR calc Af Amer: 23 mL/min — ABNORMAL LOW (ref >60)
GFR calc non Af Amer: 20 mL/min — ABNORMAL LOW (ref >60)

## 2020-03-17 LAB — PROCALCITONIN: Procalcitonin: 1.78 ng/mL

## 2020-03-17 LAB — PROTIME-INR
INR: 1.5 — ABNORMAL HIGH (ref 0.8–1.2)
Prothrombin Time: 17.3 seconds — ABNORMAL HIGH (ref 11.4–15.2)

## 2020-03-17 LAB — CORTISOL-AM, BLOOD: Cortisol - AM: 30.3 ug/dL — ABNORMAL HIGH (ref 6.7–22.6)

## 2020-03-17 LAB — LACTIC ACID, PLASMA: Lactic Acid, Venous: 1.7 mmol/L (ref 0.5–1.9)

## 2020-03-17 MED ORDER — SODIUM CHLORIDE 0.9 % IV BOLUS
500.0000 mL | Freq: Once | INTRAVENOUS | Status: AC
  Administered 2020-03-17: 500 mL via INTRAVENOUS

## 2020-03-17 MED ORDER — SODIUM CHLORIDE 0.45 % IV SOLN: INTRAVENOUS | Status: DC

## 2020-03-17 MED ORDER — SODIUM CHLORIDE 0.9 % IV SOLN: INTRAVENOUS | Status: DC | PRN

## 2020-03-17 MED ORDER — POTASSIUM CHLORIDE 10 MEQ/100ML IV SOLN
10.0000 meq | INTRAVENOUS | Status: AC
  Administered 2020-03-17 (×4): 10 meq via INTRAVENOUS
  Filled 2020-03-17 (×4): qty 100

## 2020-03-17 NOTE — ED Notes (Signed)
RN called lab. Talked with Ubaldo Glassing. She stats that phlebotomy knows and the pt is on the list to get her AM labs drawn, but that it will just be a little longer.

## 2020-03-17 NOTE — ED Notes (Signed)
Report received from [place], [place]. Pt is alert and oriented to self. V/S WNL and NAD. Pt on cardiac monitor.

## 2020-03-17 NOTE — ED Notes (Addendum)
Pt transported to floor with ED nurse on monitor

## 2020-03-17 NOTE — ED Notes (Addendum)
Provider notified of BP of 168/97. Provider stated not intervention at this time

## 2020-03-17 NOTE — ED Notes (Signed)
Provider notified of BP of 167/75

## 2020-03-17 NOTE — ED Notes (Signed)
Provider placed order for NS bolus. Provider notified that RN was unable to get morning labs, but that lab said they will send someone.

## 2020-03-17 NOTE — ED Notes (Signed)
Pt noted to not have urinated since this RN took primary care. Extra linnean removed from bed. Linnean were dry. Bladder scan completed. 58mL noted on bladder scan. Stark Klein, NP notified.

## 2020-03-17 NOTE — Progress Notes (Signed)
PROGRESS NOTE  Pamela Fox [id number] DOB: 05/07/1934 DOA: 03/16/2020 PCP: Delsa Grana, PA-C  Brief History   Pamela Fox is a 86 y.o. female with medical history significant of dementia, osteoporosis, history of breast and colon cancer, hypertension who was brought in from skilled facility with altered mental status.  Sister at bedside states she visited this morning and patient was confused and altered.  She has been shaking and appears to be in chills.  Patient brought to the emergency room where she is at the moment not able to communicate.  She has continuous tremors and chills.  She has history of tremors which is chronic.  She appears to have sepsis by criteria and urinalysis is grossly positive.  Suspected to have sepsis due to UTI and patient is being admitted to the hospital for evaluation and treatment.  She is unable to communicate at this point.  She is a DNR.Marland Kitchen  ED Course: Temperature 97.9 blood pressure 160/139 pulse 122 respiratory 25 oxygen sat 98% on 2 L.  Her mews score is 5.  White count 12.9 hemoglobin 13.5 and platelet count of 139.  Sodium 156 potassium four-point chloride 111 CO2 26 BUN 81 creatinine 2.53 calcium 9.2.  INR 1.3 glucose 124 and lactic acid 7.2.  Urinalysis shows significant bleed clouded large leukocyte WBC more than 50 with some bacteria.  COVID-19 screen is negative.  His CT without contrast is negative.  Chest x-ray shows no acute cardiopulmonary process.  Patient therefore is being admitted to the hospital for further evaluation and treatment.  The patient has been admitted to a telemetry bed. She is receiving IV cefepime for treatment of her UTI. Blood cultures x 2 and urine culture have been collected. They have demonstrated no growth. She is receiving IV fluids. Lactic acid has declined from 7.5 on presentation to 1.7 this morning. WBC has increased to 16.4. Overnight her modestly elevated AST and ALT have increased quite a bit overnight.    Consultants  . None  Procedures  . None  Antibiotics   Anti-infectives (From admission, onward)   Start     Dose/Rate Route Frequency Ordered Stop   03/17/20 2200  ceFEPIme (MAXIPIME) 1 g in sodium chloride 0.9 % 100 mL IVPB     1 g 200 mL/hr over 30 Minutes Intravenous Every 24 hours 03/16/20 1934     03/16/20 2100  ceFEPIme (MAXIPIME) 2 g in sodium chloride 0.9 % 100 mL IVPB     2 g 200 mL/hr over 30 Minutes Intravenous  Once 03/16/20 2059 03/17/20 0047   03/16/20 1715  vancomycin (VANCOCIN) IVPB 1000 mg/200 mL premix     1,000 mg 200 mL/hr over 60 Minutes Intravenous  Once 03/16/20 1705 03/16/20 2014   03/16/20 1715  piperacillin-tazobactam (ZOSYN) IVPB 3.375 g     3.375 g 100 mL/hr over 30 Minutes Intravenous  Once 03/16/20 1705 03/16/20 2013    .  Subjective  The patient is resting quietly. No new complaints.  Objective   Vitals:  Vitals:   03/17/20 1300 03/17/20 1400  BP: 137/74 (!) 165/72  Pulse: 97 99  Resp: 19 18  Temp:    SpO2: 100% 98%   Exam:  Constitutional:  . The patient is awake and alert. She appears acutely ill. No acute distress. Respiratory:  . No increased work of breathing. . No wheezes, rales, or rhonchi . No tactile fremitus Cardiovascular:  . Regular rate and rhythm . No murmurs, ectopy, or gallups. . No lateral PMI.  No thrills. Abdomen:  . Abdomen is soft, non-tender, non-distended . No hernias, masses, or organomegaly . Normoactive bowel sounds.  Musculoskeletal:  . No cyanosis, clubbing, or edema Skin:  . No rashes, lesions, ulcers . palpation of skin: no induration or nodules Neurologic:  . CN 2-12 intact . Sensation all 4 extremities intact I have personally reviewed the following:   Today's Data  . Vitals, CMP, CBC  Micro Data  . Blood Culture x 2 03/16/2020: No growth . Urine Culture: No growth  Imaging  . CT Head: No acute intracranial abnormalities . CXR: No acute abnormalities  Cardiology Data  . EKG:  Sinus tachycardia  Scheduled Meds: . heparin  5,000 Units Subcutaneous Q8H   Continuous Infusions: . sodium chloride 125 mL/hr at 03/17/20 0849  . ceFEPime (MAXIPIME) IV      Principal Problem:   Sepsis ([place]) Active Problems:   Hypernatremia   ARF (acute renal failure) (HCC)   Dehydration   LFTs abnormal   Dementia (HCC)   UTI (urinary tract infection)   LOS: 1 day   A & P  Sepsis: Pt presented with tachycardia, hypotension, leukocytosis, lactic acid of 7.5, and AKI. Source of sepsis is unknown. CXR was unremarkable. Blood cultures and urine culture have had no growth, although UA is positive for UTI. Of note, although the patient's LFT's were modestly increased at admission, overnight they have increased quite a bit with her AST increasing from 270 to 726 and ALT from 177 to 436. Her WBC has increased from 12.9 to 16.4. Certainly there appears to be UTI, but the biliary system should be investigated with a right upper quadrant ultrasound. Examination of her abdomen does seem to be benign.  UTI: The patient is receiving IV cefepime. Urine culture has had no growth. Continue to monitor cultures.  Elevated transaminases: AST and ALT have increased overnight. I will check Right upper quadrant ultrasound. Bilirubin is normal.  Acute Kidney Injury: Prerenal most likely. Creatinine is improving with Iv fluids. Monitor.  Hypernatremia due to dehydration: Change IV fluids to 1/2 NS. Continue monitoring.  Dementia: Advanced but worsened now with acute illness.   I have seen and examined this patient myself. I have spent 35 minutes in her evaluation and care.  DVT prophylaxis: Heparin Code Status: DNR Family Communication: None available Disposition Plan: Back to SNF  Aaliyan Brinkmeier, DO [facility] Direct contact: see [email or web address]  7PM-7AM contact night coverage as above 03/17/2020, 2:27 PM  LOS: 1 day   ADDENDUM: PTA medications have still not been reviewed by pharmacy.  I have asked them to do so.

## 2020-03-17 NOTE — ED Notes (Signed)
Lunch meal tray given. Sister at bedside assisting with meal.

## 2020-03-17 NOTE — ED Notes (Signed)
RN attempted to get AM blood work. RN attempted to get blood from her 3IVs and was unable to. RN then attempted to do 2 straight sticks, and was unsuccessful. Lab called and they stated they will send someone when available.

## 2020-03-17 NOTE — ED Notes (Signed)
Patient has been changed and has urinated.

## 2020-03-18 ENCOUNTER — Inpatient Hospital Stay: Payer: Medicare Other

## 2020-03-18 DIAGNOSIS — E876 Hypokalemia: Secondary | ICD-10-CM

## 2020-03-18 DIAGNOSIS — G92 Toxic encephalopathy: Secondary | ICD-10-CM

## 2020-03-18 DIAGNOSIS — R7401 Elevation of levels of liver transaminase levels: Secondary | ICD-10-CM

## 2020-03-18 DIAGNOSIS — L8990 Pressure ulcer of unspecified site, unspecified stage: Secondary | ICD-10-CM | POA: Insufficient documentation

## 2020-03-18 DIAGNOSIS — N390 Urinary tract infection, site not specified: Secondary | ICD-10-CM

## 2020-03-18 LAB — MAGNESIUM: Magnesium: 1.8 mg/dL (ref 1.7–2.4)

## 2020-03-18 LAB — BASIC METABOLIC PANEL
Anion gap: 7 (ref 5–15)
BUN: 43 mg/dL — ABNORMAL HIGH (ref 8–23)
CO2: 25 mmol/L (ref 22–32)
Calcium: 7.6 mg/dL — ABNORMAL LOW (ref 8.9–10.3)
Chloride: 121 mmol/L — ABNORMAL HIGH (ref 98–111)
Creatinine, Ser: 1.05 mg/dL — ABNORMAL HIGH (ref 0.44–1.00)
GFR calc Af Amer: 57 mL/min — ABNORMAL LOW (ref >60)
GFR calc non Af Amer: 49 mL/min — ABNORMAL LOW (ref >60)
Glucose, Bld: 72 mg/dL (ref 70–99)
Potassium: 3.4 mmol/L — ABNORMAL LOW (ref 3.5–5.1)
Sodium: 153 mmol/L — ABNORMAL HIGH (ref 135–145)

## 2020-03-18 LAB — CBC
HCT: 33.7 % — ABNORMAL LOW (ref 36.0–46.0)
Hemoglobin: 10.9 g/dL — ABNORMAL LOW (ref 12.0–15.0)
MCH: 26.8 pg (ref 26.0–34.0)
MCHC: 32.3 g/dL (ref 30.0–36.0)
MCV: 83 fL (ref 80.0–100.0)
Platelets: 114 10*3/uL — ABNORMAL LOW (ref 150–400)
RBC: 4.06 MIL/uL (ref 3.87–5.11)
RDW: 15 % (ref 11.5–15.5)
WBC: 13.8 10*3/uL — ABNORMAL HIGH (ref 4.0–10.5)
nRBC: 0.3 % — ABNORMAL HIGH (ref 0.0–0.2)

## 2020-03-18 LAB — HEPATIC FUNCTION PANEL
ALT: 296 U/L — ABNORMAL HIGH (ref 0–44)
AST: 268 U/L — ABNORMAL HIGH (ref 15–41)
Albumin: 2.4 g/dL — ABNORMAL LOW (ref 3.5–5.0)
Alkaline Phosphatase: 50 U/L (ref 38–126)
Bilirubin, Direct: 0.2 mg/dL (ref 0.0–0.2)
Indirect Bilirubin: 0.8 mg/dL (ref 0.3–0.9)
Total Bilirubin: 1 mg/dL (ref 0.3–1.2)
Total Protein: 5.2 g/dL — ABNORMAL LOW (ref 6.5–8.1)

## 2020-03-18 LAB — URINE CULTURE

## 2020-03-18 MED ORDER — POTASSIUM CHLORIDE CRYS ER 20 MEQ PO TBCR
40.0000 meq | EXTENDED_RELEASE_TABLET | Freq: Once | ORAL | Status: AC
  Administered 2020-03-18: 40 meq via ORAL
  Filled 2020-03-18: qty 2

## 2020-03-18 MED ORDER — SODIUM CHLORIDE 0.9 % IV SOLN
2.0000 g | INTRAVENOUS | Status: AC
  Administered 2020-03-18 – 2020-03-20 (×3): 2 g via INTRAVENOUS
  Filled 2020-03-18 (×3): qty 2

## 2020-03-18 MED ORDER — FREE WATER
250.0000 mL | Freq: Four times a day (QID) | Status: DC
  Administered 2020-03-18 – 2020-03-20 (×8): 250 mL via ORAL

## 2020-03-18 MED ORDER — MAGNESIUM SULFATE IN D5W 1-5 GM/100ML-% IV SOLN
1.0000 g | Freq: Once | INTRAVENOUS | Status: AC
  Administered 2020-03-18: 1 g via INTRAVENOUS
  Filled 2020-03-18: qty 100

## 2020-03-18 MED ORDER — DEXTROSE 5 % IV SOLN: INTRAVENOUS | Status: AC

## 2020-03-18 NOTE — Progress Notes (Signed)
PHARMACY NOTE:  ANTIMICROBIAL RENAL DOSAGE ADJUSTMENT  Current antimicrobial regimen includes a mismatch between antimicrobial dosage and estimated renal function.  As per policy approved by the Pharmacy & Therapeutics and Medical Executive Committees, the antimicrobial dosage will be adjusted accordingly.  Current antimicrobial dosage:  Cefepime 1g IV Q24 hours  Indication: UTI  Renal Function: CrCl 30-5ml/min  Estimated Creatinine Clearance: 36.3 mL/min (A) (by C-G formula based on SCr of 1.05 mg/dL (H)).     Antimicrobial dosage has been changed to:  Cefepime 2g IV Q24 hours.    Thank you for allowing pharmacy to be a part of this patient's care.  Pearla Dubonnet, [facility] 03/18/2020 11:14 AM

## 2020-03-18 NOTE — Clinical Social Work Note (Signed)
Pt is from [facility], however declined noted. Asked MD to put in PT eval to determine which level of care pt will require at d/c.  Elizabeth, [place]

## 2020-03-18 NOTE — Progress Notes (Signed)
PROGRESS NOTE    Pamela Fox  [id number] DOB: Feb 24, 1934 DOA: 03/16/2020 PCP: Delsa Grana, PA-C    Chief Complaint  Patient presents with  . Code Sepsis  . Unresponsive    Brief Narrative:  86 year old female with history of dementia, osteoporosis, breast and colon cancer, hypertension brought from SNF with altered mental status.  As per the sister patient was found to be confused and somnolent when she visited her on the day of admission.  She appeared to be having chills.  When she was brought to the ED she was not communicating.  Patient had positive UA and was septic on admission (tachycardic, tachypneic, elevated WBC, hypernatremia hyperchloremia, AKI, lactic acid 7.2 and significant transaminitis. COVID-19 PCR negative.  Chest x-ray and CT abdomen without acute finding. Patient placed on empiric IV cefepime for UTI.  Assessment & Plan:   Principal Problem: Severe sepsis without septic shock ([place]) Likely secondary to UTI. Lactic acid improved with IV fluids.  Sepsis currently resolved.  Continue empiric cefepime.  Follow urine and blood culture.  LFTs slowly trending down.  Patient still encephalopathic.  Continue IV hydration (fluid switch to D5).  Active Problems: Acute kidney injury ([place]) Suspect prerenal ATN with sepsis and UTI.  Will switch half-normal saline to D5 water.  Renal function improving.  Transaminitis Suspect shock liver with sepsis.  LFTs trending down.  Check hepatitis panel and avoid hepatotoxic agents.  Abdominal ultrasound showed cholelithiasis without acute cholecystitis or biliary ductal dilatation.  Monitor daily.  Hypernatremia Secondary to dehydration.  Fluids switch to D5.  Add free water every 6 hours if patient compliant.  Dementia, moderate to severe Appears to have worsened symptoms with encephalopathy.  Avoid benzos or narcotics.  Check B12, TSH and RPR.  Monitor for now.  Stage I pressure injury to the sacrum Care per  nursing.  Hypokalemia/hypomagnesemia Replenished with IV.   DVT prophylaxis: Subcu Lovenox Code Status: Full code Family Communication:  will update sister Disposition:   Status is: Inpatient  Remains inpatient appropriate because:Altered mental status   Dispo: The patient is from: SNF              Anticipated d/c is to: SNF              Anticipated d/c date is: 2 days              Patient currently is not medically stable to d/c.        Consultants:   None   Procedures: Head CT, liver ultrasound   Antimicrobials: IV cefepime   Subjective: Seen and examined.  Sleepy and poorly arousable.  Noncommunicative.  Objective: Vitals:   03/17/20 1618 03/17/20 2012 03/18/20 0456 03/18/20 1159  BP: (!) 154/70 131/62 (!) 153/73 112/62  Pulse: (!) 101 (!) 101 (!) 104 76  Resp: 16 18 20 18   Temp: 99.3 F (37.4 C) 98.7 F (37.1 C) 98.9 F (37.2 C) 97.9 F (36.6 C)  TempSrc: Oral Oral Oral   SpO2: 100% 98% 100% 97%  Weight:      Height:        Intake/Output Summary (Last 24 hours) at 03/18/2020 1452 Last data filed at 03/18/2020 1345 Gross per 24 hour  Intake 2501.79 ml  Output 950 ml  Net 1551.79 ml   Filed Weights   03/16/20 1550  Weight: 63 kg    Examination:  General: Elderly female, sleepy, poorly arousable, not in distress HEENT: No icterus, dry mucosa, supple neck Chest: Clear bilaterally CVs:  Normal S1-S2 GI: Soft, nondistended or nontender, bowel sounds present Musculoskeletal: Warm, no CNs: Poorly arousable to command, winces with painful stimuli, moving all extremities    Data Reviewed: I have personally reviewed following labs and imaging studies  CBC: Recent Labs  Lab 03/16/20 1617 03/16/20 2352 03/17/20 0758 03/18/20 0449  WBC 12.9* 17.3* 16.4* 13.8*  NEUTROABS 10.3*  --   --   --   HGB 15.5* 12.3 11.5* 10.9*  HCT 49.3* 38.8 36.3 33.7*  MCV 85.7 84.0 84.2 83.0  PLT 139* 99* 105* 114*    Basic Metabolic Panel: Recent Labs   Lab 03/16/20 1617 03/16/20 2352 03/17/20 0758 03/18/20 0449  NA 156*  --  155* 153*  K 4.1  --  3.3* 3.4*  CL 111  --  118* 121*  CO2 26  --  25 25  GLUCOSE 124*  --  92 72  BUN 81*  --  73* 43*  CREATININE 2.53* 2.20* 1.75* 1.05*  CALCIUM 9.2  --  8.0* 7.6*  MG  --   --   --  1.8    GFR: Estimated Creatinine Clearance: 36.3 mL/min (A) (by C-G formula based on SCr of 1.05 mg/dL (H)).  Liver Function Tests: Recent Labs  Lab 03/16/20 1617 03/17/20 0758 03/18/20 0833  AST 270* 726* 268*  ALT 177* 436* 296*  ALKPHOS 79 54 50  BILITOT 1.2 1.2 1.0  PROT 7.9 5.6* 5.2*  ALBUMIN 3.5 2.6* 2.4*    CBG: No results for input(s): GLUCAP in the last 168 hours.   Recent Results (from the past 240 hour(s))  Urine culture     Status: Abnormal   Collection Time: 03/16/20  3:39 PM   Specimen: Urine, Random  Result Value Ref Range Status   Specimen Description   Final    URINE, RANDOM Performed at [facility], [address] [place], [place] [address]    Special Requests   Final    NONE Performed at [facility], [address] [place], [place] [address]    Culture MULTIPLE SPECIES PRESENT, SUGGEST RECOLLECTION (A)  Final   Report Status 03/18/2020 FINAL  Final  Blood Culture (routine x 2)     Status: None (Preliminary result)   Collection Time: 03/16/20  4:17 PM   Specimen: BLOOD  Result Value Ref Range Status   Specimen Description BLOOD BLOOD RIGHT ARM  Final   Special Requests   Final    BOTTLES DRAWN AEROBIC AND ANAEROBIC Blood Culture adequate volume   Culture   Final    NO GROWTH 2 DAYS Performed at [facility], [address], [place], [place] [address]    Report Status PENDING  Incomplete  Blood Culture (routine x 2)     Status: None (Preliminary result)   Collection Time: 03/16/20  4:17 PM   Specimen: BLOOD  Result Value Ref Range Status   Specimen Description BLOOD RIGHT ARM  Final   Special Requests   Final    BOTTLES  DRAWN AEROBIC AND ANAEROBIC Blood Culture adequate volume   Culture   Final    NO GROWTH 2 DAYS Performed at [facility], [address], [place],  [address]    Report Status PENDING  Incomplete  SARS Coronavirus 2 by RT PCR (hospital order, performed in [place] hospital lab) Nasopharyngeal Nasopharyngeal Swab     Status: None   Collection Time: 03/16/20  4:17 PM   Specimen: Nasopharyngeal Swab  Result Value Ref  Range Status   SARS Coronavirus 2 NEGATIVE NEGATIVE Final    Comment: (NOTE) SARS-CoV-2 target nucleic acids are NOT DETECTED. The SARS-CoV-2 RNA is generally detectable in upper and lower respiratory specimens during the acute phase of infection. The lowest concentration of SARS-CoV-2 viral copies this assay can detect is 250 copies / mL. A negative result does not preclude SARS-CoV-2 infection and should not be used as the sole basis for treatment or other patient management decisions.  A negative result may occur with improper specimen collection / handling, submission of specimen other than nasopharyngeal swab, presence of viral mutation(s) within the areas targeted by this assay, and inadequate number of viral copies (<250 copies / mL). A negative result must be combined with clinical observations, patient history, and epidemiological information. Fact Sheet for Patients:   StrictlyIdeas.no Fact Sheet for Healthcare Providers: BankingDealers.co.za This test is not yet approved or cleared  by the [place] FDA and has been authorized for detection and/or diagnosis of SARS-CoV-2 by FDA under an Emergency Use Authorization (EUA).  This EUA will remain in effect (meaning this test can be used) for the duration of the COVID-19 declaration under Section 564(b)(1) of the Act, 21 U.S.C. section 360bbb-3(b)(1), unless the authorization is terminated or revoked sooner. Performed at [facility],  [address], [place], [place] [address]          Radiology Studies: CT Head Wo Contrast  Result Date: 03/16/2020 CLINICAL DATA:  Encephalopathy.  The client in mental status. EXAM: CT HEAD WITHOUT CONTRAST TECHNIQUE: Contiguous axial images were obtained from the base of the skull through the vertex without intravenous contrast. COMPARISON:  Mar 05, 2020 FINDINGS: Brain: No subdural, epidural, or subarachnoid hemorrhage. Cerebellum, brainstem, and basal cisterns are normal. Ventricles and sulci remain prominent but stable. White matter changes are unchanged. No acute cortical ischemia or infarct. No mass effect or midline shift. Vascular: Calcified atherosclerosis is seen in the intracranial carotids. Skull: Normal. Negative for fracture or focal lesion. Sinuses/Orbits: No acute finding. Other: None. IMPRESSION: 1. Chronic white matter changes. No acute intracranial abnormalities. Electronically Signed   By: Dorise Bullion III M.D   On: 03/16/2020 18:11   DG Chest Port 1 View  Result Date: 03/16/2020 CLINICAL DATA:  Altered mental status. EXAM: PORTABLE CHEST 1 VIEW COMPARISON:  None. FINDINGS: The cardiomediastinal contours are within normal limits. The lungs are clear. No pneumothorax or significant pleural effusion. There is no acute finding in the visualized skeleton. IMPRESSION: No acute cardiopulmonary process. Electronically Signed   By: Audie Pinto M.D.   On: 03/16/2020 16:11   US Abdomen Limited RUQ  Result Date: 03/18/2020 CLINICAL DATA:  Transaminitis EXAM: ULTRASOUND ABDOMEN LIMITED RIGHT UPPER QUADRANT COMPARISON:  None. FINDINGS: Gallbladder: Mobile 1.3 cm gallstone. No gallbladder distention. No gallbladder wall thickening. No pericholecystic fluid. No sonographic Murphy's sign. Common bile duct: Diameter: 5 mm Liver: Liver parenchymal echogenicity and echotexture are within normal limits. No liver masses, noting decreased sensitivity in the setting of an echogenic  liver. Portal vein is patent on color Doppler imaging with normal direction of blood flow towards the liver. Other: None. IMPRESSION: 1. Cholelithiasis.  No sonographic evidence of acute cholecystitis. 2. No biliary ductal dilatation. 3. Normal liver. Electronically Signed   By: Ilona Sorrel M.D.   On: 03/18/2020 06:47        Scheduled Meds: . free water  250 mL Oral Q6H  . heparin  5,000 Units Subcutaneous Q8H   Continuous Infusions: .  sodium chloride    . ceFEPime (MAXIPIME) IV    . dextrose Stopped (03/18/20 1030)     LOS: 2 days    Time spent: 35 minutes    Eriona Kinchen, MD [facility]   To contact the attending provider between 7A-7P or the covering provider during after hours 7P-7A, please log into the web site [email or web address] and access using universal [place] password for that web site. If you do not have the password, please call the hospital operator.  03/18/2020, 2:52 PM

## 2020-03-19 DIAGNOSIS — G9341 Metabolic encephalopathy: Secondary | ICD-10-CM

## 2020-03-19 LAB — COMPREHENSIVE METABOLIC PANEL
ALT: 204 U/L — ABNORMAL HIGH (ref 0–44)
AST: 148 U/L — ABNORMAL HIGH (ref 15–41)
Albumin: 2.2 g/dL — ABNORMAL LOW (ref 3.5–5.0)
Alkaline Phosphatase: 48 U/L (ref 38–126)
Anion gap: 8 (ref 5–15)
BUN: 19 mg/dL (ref 8–23)
CO2: 26 mmol/L (ref 22–32)
Calcium: 7.5 mg/dL — ABNORMAL LOW (ref 8.9–10.3)
Chloride: 108 mmol/L (ref 98–111)
Creatinine, Ser: 0.77 mg/dL (ref 0.44–1.00)
GFR calc Af Amer: >60 mL/min (ref >60)
GFR calc non Af Amer: >60 mL/min (ref >60)
Glucose, Bld: 107 mg/dL — ABNORMAL HIGH (ref 70–99)
Potassium: 3.2 mmol/L — ABNORMAL LOW (ref 3.5–5.1)
Sodium: 142 mmol/L (ref 135–145)
Total Bilirubin: 0.9 mg/dL (ref 0.3–1.2)
Total Protein: 4.9 g/dL — ABNORMAL LOW (ref 6.5–8.1)

## 2020-03-19 LAB — HEPATITIS PANEL, ACUTE
HCV Ab: NONREACTIVE
Hep A IgM: NONREACTIVE
Hep B C IgM: NONREACTIVE
Hepatitis B Surface Ag: NONREACTIVE

## 2020-03-19 LAB — TSH: TSH: 2.689 u[IU]/mL (ref 0.350–4.500)

## 2020-03-19 LAB — VITAMIN B12: Vitamin B-12: 944 pg/mL — ABNORMAL HIGH (ref 180–914)

## 2020-03-19 LAB — HIV ANTIBODY (ROUTINE TESTING W REFLEX): HIV Screen 4th Generation wRfx: NONREACTIVE

## 2020-03-19 MED ORDER — POTASSIUM CHLORIDE 20 MEQ PO PACK
40.0000 meq | PACK | Freq: Once | ORAL | Status: AC
  Administered 2020-03-19: 40 meq via ORAL
  Filled 2020-03-19: qty 2

## 2020-03-19 NOTE — NC FL2 (Signed)
[place] LEVEL OF CARE SCREENING TOOL     IDENTIFICATION  Patient Name: Pamela Fox Birthdate: December 13, 1933 Sex: female Admission Date (Current Location): 03/16/2020  [facility] and [place] Number:  Engineering geologist and Address:         Provider Number: [id number]  Attending Physician Name and Address:  Louellen Molder, MD  Relative Name and Phone Number:       Current Level of Care: Hospital Recommended Level of Care: [place] Prior Approval Number:    Date Approved/Denied:   PASRR Number: [id number]  Discharge Plan: SNF    Current Diagnoses: Patient Active Problem List   Diagnosis Date Noted  . Pressure injury of skin 03/18/2020  . Sepsis ([place]) 03/16/2020  . Hypernatremia 03/16/2020  . ARF (acute renal failure) ([place]) 03/16/2020  . Dehydration 03/16/2020  . LFTs abnormal 03/16/2020  . Dementia ([place]) 03/16/2020  . UTI (urinary tract infection) 03/16/2020    Orientation RESPIRATION BLADDER Height & Weight     Self  Normal Incontinent, External catheter Weight: 63 kg Height:  5\' 3"  (160 cm)  BEHAVIORAL SYMPTOMS/MOOD NEUROLOGICAL BOWEL NUTRITION STATUS      Incontinent Diet(Dys 3 thin liquid)  AMBULATORY STATUS COMMUNICATION OF NEEDS Skin   Extensive Assist Verbally PU Stage and Appropriate Care, Bruising                       Personal Care Assistance Level of Assistance              Functional Limitations Info             SPECIAL CARE FACTORS FREQUENCY  PT (By licensed PT), OT (By licensed OT)                    Contractures Contractures Info: Not present    Additional Factors Info  Code Status, Allergies Code Status Info: Full Allergies Info: NKDA           Current Medications (03/19/2020):  This is the current hospital active medication list Current Facility-Administered Medications  Medication Dose Route Frequency Provider Last Rate Last Admin  . 0.9 %  sodium chloride infusion    Intravenous PRN Swayze, Ava, DO      . acetaminophen (TYLENOL) tablet 650 mg  650 mg Oral Q6H PRN Elwyn Reach, MD       Or  . acetaminophen (TYLENOL) suppository 650 mg  650 mg Rectal Q6H PRN Gala Romney L, MD      . ceFEPIme (MAXIPIME) 2 g in sodium chloride 0.9 % 100 mL IVPB  2 g Intravenous Q24H Dhungel, Nishant, MD   Stopped at 03/18/20 2151  . free water 250 mL  250 mL Oral Q6H Dhungel, Nishant, MD   250 mL at 03/19/20 1215  . heparin injection 5,000 Units  5,000 Units Subcutaneous Q8H Elwyn Reach, MD   5,000 Units at 03/19/20 0736  . HYDROcodone-acetaminophen (NORCO/VICODIN) 5-325 MG per tablet 1-2 tablet  1-2 tablet Oral Q4H PRN Gala Romney L, MD      . ondansetron (ZOFRAN) tablet 4 mg  4 mg Oral Q6H PRN Elwyn Reach, MD       Or  . ondansetron (ZOFRAN) injection 4 mg  4 mg Intravenous Q6H PRN Elwyn Reach, MD         Discharge Medications: Please see discharge summary for a list of discharge medications.  Relevant Imaging Results:  Relevant Lab Results:  Additional Information ss# [id number]  Beverly Sessions, RN

## 2020-03-19 NOTE — Care Management Important Message (Signed)
Important Message  Patient Details  Name: Pamela Fox MRN: [id number] Date of Birth: [birth year]   Medicare Important Message Given:  Yes     Dannette Barbara 03/19/2020, 11:28 AM

## 2020-03-19 NOTE — Evaluation (Addendum)
Physical Therapy Evaluation Patient Details Name: Pamela Fox MRN: [id number] DOB: [birth year] Today's Date: 03/19/2020   History of Present Illness  Pt is an 86 y.o. female presenting to hospital 5/29 with AMS; pt admitted with sepsis with organ damage, UTI, acute renal failure, severe dehydration, and abnormal LFT's.  PMH includes dementia, h/o breast and colon CA, htn.  Clinical Impression  Prior to hospital admission, per chart review pt's sister reports pt was ambulatory when admitted to [facility] 03/04/20 but then fell next day and was not able to walk or complete ADL's anymore (date of admission confirmed with [facility] case manager; [facility] CM contacted director of [facility] facility who reports pt was very unsteady and not eating).  Therapist had called and spoken with Deana (staff at [facility]) who reported pt was recently requiring 1-2 assist with transfers to w/c (see below for further functional details).  Currently pt is 2 assist with logrolling from R side onto her back but then pt able to logroll back to R side with use of bed rail; per staff at [facility], pt has been resisting mobility at times.  Deferred further mobility d/t pt resisting mobility attempts.  Pt would benefit from skilled PT to address noted impairments and functional limitations (see below for any additional details).  Upon hospital discharge, pt would benefit from STR (to attempt to return to recent ambulatory status).    Follow Up Recommendations SNF    Equipment Recommendations  Rolling walker with 5" wheels;3in1 (PT);Wheelchair (measurements PT);Wheelchair cushion (measurements PT)    Recommendations for Other Services OT consult     Precautions / Restrictions Precautions Precautions: Fall Restrictions Weight Bearing Restrictions: No      Mobility  Bed Mobility Overal bed mobility: Needs Assistance Bed Mobility: Rolling Rolling: +2 for physical assistance         General bed  mobility comments: pt laying on R side upon PT arriving to room but pt resisting therapists attempts to perform logrolling towards L side; able to logroll pt onto her back with 2 assist but pt resisting further movement and using bedrail pt logrolled back to her R side  Transfers                 General transfer comment: deferred d/t pt resisting movement  Ambulation/Gait             General Gait Details: deferred d/t pt resisting movement and pt not recently ambulatory  Stairs            Wheelchair Mobility    Modified Rankin (Stroke Patients Only)       Balance                                             Pertinent Vitals/Pain Pain Assessment: Faces Pain Score: 4  Faces Pain Scale: Hurts little more(0/10 at rest) Pain Location: general body pain with attempted mobility Pain Descriptors / Indicators: Guarding Pain Intervention(s): Limited activity within patient's tolerance;Monitored during session;Repositioned    Home Living Family/patient expects to be discharged to:: Assisted living               Home Equipment: Wheelchair - manual;Bedside commode Additional Comments: Furniture conservator/restorer    Prior Function Level of Independence: Needs assistance   Gait / Transfers Assistance Needed: Per Physicist, medical (staff member at [facility]): pt 1-2 assist with  transfers (stand pivot); staff assists pt with propelling w/c; pt tends to slouch in w/c and tends to like to curl up in bed; pt also tends to resist movement intermittently as well (pt fearful of falling)  ADL's / Homemaking Assistance Needed: pt can eat with plate in lap and using fingers but does not always eat; switches between using BSC vs needing clean-up for toileting  Comments: Per chart review pt's sister reports pt was admitted to [facility] on 5/17 and fell next day--was able to walk in but now unable to walk and unable to complete ADL's     Hand Dominance         Extremity/Trunk Assessment   Upper Extremity Assessment Upper Extremity Assessment: Difficult to assess due to impaired cognition(R>L hand grip strength; good B elbow flexion ROM (pt resisting attempts at B elbow extension and B shoulder flexion PROM so unable to assess))    Lower Extremity Assessment Lower Extremity Assessment: Difficult to assess due to impaired cognition(good B hip flexion ROM; L knee extension PROM at least 20 degrees short of neutral and R knee extension PROM at least 45 degrees short of neutral--pt appearing to be resisting so unable to assess full ROM); B DF/PF PROM WFL)    Cervical / Trunk Assessment Cervical / Trunk Assessment: Other exceptions Cervical / Trunk Exceptions: pt preferring forward flexed trunk and neck positioning in bed  Communication   Communication: (difficulty understanding pt intermittently (py speaking softly))  Cognition Arousal/Alertness: Awake/alert Behavior During Therapy: Flat affect Overall Cognitive Status: No family/caregiver present to determine baseline cognitive functioning                                 General Comments: Oriented to at least DOB (pt did not answer rest of orientation questions)      General Comments General comments (skin integrity, edema, etc.): Bruising noted to B LE's; abrasion noted to L shoulder  Nursing cleared pt for participation in physical therapy.  Pt agreeable to PT session.    Exercises  Mobility cueing   Assessment/Plan    PT Assessment Patient needs continued PT services  PT Problem List Decreased strength;Decreased range of motion;Decreased activity tolerance;Decreased balance;Decreased mobility;Decreased cognition;Decreased knowledge of use of DME;Decreased knowledge of precautions       PT Treatment Interventions DME instruction;Functional mobility training;Therapeutic activities;Therapeutic exercise;Balance training;Patient/family education;Gait training    PT Goals  (Current goals can be found in the Care Plan section)  Acute Rehab PT Goals PT Goal Formulation: Patient unable to participate in goal setting Time For Goal Achievement: 04/02/20 Potential to Achieve Goals: Fair    Frequency Min 2X/week   Barriers to discharge        Co-evaluation               AM-PAC PT "6 Clicks" Mobility  Outcome Measure Help needed turning from your back to your side while in a flat bed without using bedrails?: Total Help needed moving from lying on your back to sitting on the side of a flat bed without using bedrails?: Total Help needed moving to and from a bed to a chair (including a wheelchair)?: Total Help needed standing up from a chair using your arms (e.g., wheelchair or bedside chair)?: Total Help needed to walk in hospital room?: Total Help needed climbing 3-5 steps with a railing? : Total 6 Click Score: 6    End of Session   Activity  Tolerance: Patient tolerated treatment well Patient left: in bed;with call bell/phone within reach;with bed alarm set Nurse Communication: Mobility status;Precautions;Other (comment)(pt requiring clean-up d/t bowel incontinence) PT Visit Diagnosis: Other abnormalities of gait and mobility (R26.89);Muscle weakness (generalized) (M62.81);History of falling (Z91.81)    Time: [id number] PT Time Calculation (min) (ACUTE ONLY): 24 min   Charges:   PT Evaluation $PT Eval Low Complexity: 1 Low PT Treatments $Therapeutic Activity: 8-22 mins       Leitha Bleak, PT 03/19/20, 10:49 AM

## 2020-03-19 NOTE — TOC Initial Note (Signed)
Transition of Care [facility]) - Initial/Assessment Note    Patient Details  Name: Pamela Fox MRN: [id number] Date of Birth: 1934-04-20  Transition of Care [facility]) CM/SW Contact:    Beverly Sessions, RN Phone Number: 03/19/2020, 3:16 PM  Clinical Narrative:                 Patient admitted from [place] spoke with director Claiborne Billings at [facility] who states that patient was admitted to their facility on 03/04/20  States that patient has been unsteady on her feet, and not eating well  Patient with history of demenita.   Spoke with sister [name] on the phone.   PT has assessed patient and recommends SNF.  [name] is in agreement.  [name] states that she does not intend for patient to return to [facility] when patient completes rehab.  [name] states that she is working with the patient;s LTC insurance plan to make arrangements at another facility   Pasrr obtained. fl2 sent for signature Bed search initiated   Expected Discharge Plan: Skilled Nursing Facility Barriers to Discharge: Continued Medical Work up   Patient Goals and CMS Choice        Expected Discharge Plan and Services Expected Discharge Plan: [place]       Living arrangements for the past 2 months: Assisted Living Facility(memory care)                                      Prior Living Arrangements/Services Living arrangements for the past 2 months: Assisted Living Facility(memory care) Lives with:: Facility Resident                   Activities of Daily Living Home Assistive Devices/Equipment: None ADL Screening (condition at time of admission) Patient's cognitive ability adequate to safely complete daily activities?: Yes Is the patient deaf or have difficulty hearing?: No Does the patient have difficulty seeing, even when wearing glasses/contacts?: No Does the patient have difficulty concentrating, remembering, or making decisions?: Yes Patient able to  express need for assistance with ADLs?: No Does the patient have difficulty dressing or bathing?: No Independently performs ADLs?: No Communication: Independent Dressing (OT): Needs assistance Is this a change from baseline?: Pre-admission baseline Grooming: Needs assistance Is this a change from baseline?: Pre-admission baseline Feeding: Needs assistance Is this a change from baseline?: Change from baseline, expected to last <3 days Bathing: Needs assistance Is this a change from baseline?: Pre-admission baseline Toileting: Needs assistance Is this a change from baseline?: Pre-admission baseline In/Out Bed: Needs assistance Is this a change from baseline?: Pre-admission baseline Walks in Home: Needs assistance Is this a change from baseline?: Pre-admission baseline Does the patient have difficulty walking or climbing stairs?: Yes Weakness of Legs: Both Weakness of Arms/Hands: Both  Permission Sought/Granted                  Emotional Assessment              Admission diagnosis:  Lactic acidosis [E87.2] Transaminitis [R74.01] Sepsis ([place]) [A41.9] Urinary tract infection without hematuria, site unspecified [N39.0] Acute renal failure, unspecified acute renal failure type ([place]) [N17.9] Altered mental status, unspecified altered mental status type [R41.82] Patient Active Problem List   Diagnosis Date Noted  . Pressure injury of skin 03/18/2020  . Sepsis ([place]) 03/16/2020  . Hypernatremia 03/16/2020  . ARF (acute renal failure) ([place])  03/16/2020  . Dehydration 03/16/2020  . LFTs abnormal 03/16/2020  . Dementia ([place]) 03/16/2020  . UTI (urinary tract infection) 03/16/2020   PCP:  Delsa Grana, PA-C Pharmacy:  No Pharmacies Listed    Social Determinants of Health (SDOH) Interventions    Readmission Risk Interventions No flowsheet data found.

## 2020-03-19 NOTE — Progress Notes (Signed)
PROGRESS NOTE    Pamela Fox  [id number] DOB: 1933/12/23 DOA: 03/16/2020 PCP: Delsa Grana, PA-C    Chief Complaint  Patient presents with  . Code Sepsis  . Unresponsive    Brief Narrative:  86 year old female with history of dementia, osteoporosis, breast and colon cancer, hypertension brought from SNF with altered mental status.  As per the sister patient was found to be confused and somnolent when she visited her on the day of admission.  She appeared to be having chills.  When she was brought to the ED she was not communicating.  Patient had positive UA and was septic on admission (tachycardic, tachypneic, elevated WBC, hypernatremia hyperchloremia, AKI, lactic acid 7.2 and significant transaminitis. COVID-19 PCR negative.  Chest x-ray and CT abdomen without acute finding. Patient placed on empiric IV cefepime for UTI.  Assessment & Plan:   Principal Problem: Severe sepsis without septic shock ([place]) Likely secondary to UTI. Lactic acid improved with IV fluids.  Sepsis currently resolved.  Continue empiric cefepime.  Follow urine and blood culture.  LFTs slowly trending down. Encephalopathy improving. Continue gentle IV fluids.  Active Problems: Acute kidney injury ([place]) Suspect prerenal ATN with sepsis and UTI. Renal function improved with normal saline and D5 water. Continue for another day.  Transaminitis Suspect shock liver with sepsis.  LFTs trending down. Hepatitis panel negative. Abdominal ultrasound showed cholelithiasis without acute cholecystitis or biliary ductal dilatation.  Monitor daily.  Hypernatremia Secondary to dehydration. Improved in a.m. lab with fluids switched to D5. Continue free water.   Dementia, moderate to severe Appears to have worsened symptoms with encephalopathy.  Avoid benzos or narcotics. Follow B12, TSH and RPR. Patient more awake but still confused. As per her sister her dementia seems to be worsening.  Stage I pressure injury to  the sacrum Care per nursing.  Hypokalemia/hypomagnesemia Replenished.   DVT prophylaxis: Subcu Lovenox Code Status: Full code Family Communication: Sister updated. Disposition:   Status is: Inpatient  Remains inpatient appropriate because:Altered mental status, transaminitis. Possible discharge back to SNF if mentation continues to improve Lotensin it is better.   Dispo: The patient is from: SNF              Anticipated d/c is to: SNF              Anticipated d/c date is: 1 day              Patient currently is not medically stable to d/c.        Consultants:   None   Procedures: Head CT, liver ultrasound   Antimicrobials: IV cefepime   Subjective: Seen and examined.  Sleepy and poorly arousable.  Noncommunicative.  Objective: Vitals:   03/18/20 0456 03/18/20 1159 03/18/20 2017 03/19/20 0611  BP: (!) 153/73 112/62 109/60 (!) 157/77  Pulse: (!) 104 76 84 91  Resp: 20 18 20 18   Temp: 98.9 F (37.2 C) 97.9 F (36.6 C) 97.6 F (36.4 C) 97.7 F (36.5 C)  TempSrc: Oral  Oral Oral  SpO2: 100% 97% 96% 99%  Weight:      Height:        Intake/Output Summary (Last 24 hours) at 03/19/2020 1511 Last data filed at 03/19/2020 1348 Gross per 24 hour  Intake 2308.21 ml  Output 1330 ml  Net 978.21 ml   Filed Weights   03/16/20 1550  Weight: 63 kg   Physical exam Confused, more awake today HEENT: Moist mucosa, supple neck Chest: Clear CVs: Normal S1-S2  GI: Soft, nontender, nondistended Musculoskeletal: Warm, no edema CNS: Alert awake, oriented to self    Data Reviewed: I have personally reviewed following labs and imaging studies  CBC: Recent Labs  Lab 03/16/20 1617 03/16/20 2352 03/17/20 0758 03/18/20 0449  WBC 12.9* 17.3* 16.4* 13.8*  NEUTROABS 10.3*  --   --   --   HGB 15.5* 12.3 11.5* 10.9*  HCT 49.3* 38.8 36.3 33.7*  MCV 85.7 84.0 84.2 83.0  PLT 139* 99* 105* 114*    Basic Metabolic Panel: Recent Labs  Lab 03/16/20 1617 03/16/20 2352  03/17/20 0758 03/18/20 0449 03/19/20 0441  NA 156*  --  155* 153* 142  K 4.1  --  3.3* 3.4* 3.2*  CL 111  --  118* 121* 108  CO2 26  --  25 25 26   GLUCOSE 124*  --  92 72 107*  BUN 81*  --  73* 43* 19  CREATININE 2.53* 2.20* 1.75* 1.05* 0.77  CALCIUM 9.2  --  8.0* 7.6* 7.5*  MG  --   --   --  1.8  --     GFR: Estimated Creatinine Clearance: 47.6 mL/min (by C-G formula based on SCr of 0.77 mg/dL).  Liver Function Tests: Recent Labs  Lab 03/16/20 1617 03/17/20 0758 03/18/20 0833 03/19/20 0441  AST 270* 726* 268* 148*  ALT 177* 436* 296* 204*  ALKPHOS 79 54 50 48  BILITOT 1.2 1.2 1.0 0.9  PROT 7.9 5.6* 5.2* 4.9*  ALBUMIN 3.5 2.6* 2.4* 2.2*    CBG: No results for input(s): GLUCAP in the last 168 hours.   Recent Results (from the past 240 hour(s))  Urine culture     Status: Abnormal   Collection Time: 03/16/20  3:39 PM   Specimen: Urine, Random  Result Value Ref Range Status   Specimen Description   Final    URINE, RANDOM Performed at [facility], [address] [place], [place] [address]    Special Requests   Final    NONE Performed at [facility], [address] [place], [place] [address]    Culture MULTIPLE SPECIES PRESENT, SUGGEST RECOLLECTION (A)  Final   Report Status 03/18/2020 FINAL  Final  Blood Culture (routine x 2)     Status: None (Preliminary result)   Collection Time: 03/16/20  4:17 PM   Specimen: BLOOD  Result Value Ref Range Status   Specimen Description BLOOD BLOOD RIGHT ARM  Final   Special Requests   Final    BOTTLES DRAWN AEROBIC AND ANAEROBIC Blood Culture adequate volume   Culture   Final    NO GROWTH 3 DAYS Performed at [facility], [address] [place], [place] [address]    Report Status PENDING  Incomplete  Blood Culture (routine x 2)     Status: None (Preliminary result)   Collection Time: 03/16/20  4:17 PM   Specimen: BLOOD  Result Value Ref Range Status   Specimen Description BLOOD RIGHT  ARM  Final   Special Requests   Final    BOTTLES DRAWN AEROBIC AND ANAEROBIC Blood Culture adequate volume   Culture   Final    NO GROWTH 3 DAYS Performed at [facility], [address], [place], [place] [address]    Report Status PENDING  Incomplete  SARS Coronavirus 2 by RT PCR (hospital order, performed in [place] hospital lab) Nasopharyngeal Nasopharyngeal Swab     Status: None   Collection Time: 03/16/20  4:17 PM  Specimen: Nasopharyngeal Swab  Result Value Ref Range Status   SARS Coronavirus 2 NEGATIVE NEGATIVE Final    Comment: (NOTE) SARS-CoV-2 target nucleic acids are NOT DETECTED. The SARS-CoV-2 RNA is generally detectable in upper and lower respiratory specimens during the acute phase of infection. The lowest concentration of SARS-CoV-2 viral copies this assay can detect is 250 copies / mL. A negative result does not preclude SARS-CoV-2 infection and should not be used as the sole basis for treatment or other patient management decisions.  A negative result may occur with improper specimen collection / handling, submission of specimen other than nasopharyngeal swab, presence of viral mutation(s) within the areas targeted by this assay, and inadequate number of viral copies (<250 copies / mL). A negative result must be combined with clinical observations, patient history, and epidemiological information. Fact Sheet for Patients:   StrictlyIdeas.no Fact Sheet for Healthcare Providers: BankingDealers.co.za This test is not yet approved or cleared  by the [place] FDA and has been authorized for detection and/or diagnosis of SARS-CoV-2 by FDA under an Emergency Use Authorization (EUA).  This EUA will remain in effect (meaning this test can be used) for the duration of the COVID-19 declaration under Section 564(b)(1) of the Act, 21 U.S.C. section 360bbb-3(b)(1), unless the authorization is terminated  or revoked sooner. Performed at [facility], [address], [place], [place] [address]          Radiology Studies: US Abdomen Limited RUQ  Result Date: 03/18/2020 CLINICAL DATA:  Transaminitis EXAM: ULTRASOUND ABDOMEN LIMITED RIGHT UPPER QUADRANT COMPARISON:  None. FINDINGS: Gallbladder: Mobile 1.3 cm gallstone. No gallbladder distention. No gallbladder wall thickening. No pericholecystic fluid. No sonographic Murphy's sign. Common bile duct: Diameter: 5 mm Liver: Liver parenchymal echogenicity and echotexture are within normal limits. No liver masses, noting decreased sensitivity in the setting of an echogenic liver. Portal vein is patent on color Doppler imaging with normal direction of blood flow towards the liver. Other: None. IMPRESSION: 1. Cholelithiasis.  No sonographic evidence of acute cholecystitis. 2. No biliary ductal dilatation. 3. Normal liver. Electronically Signed   By: Ilona Sorrel M.D.   On: 03/18/2020 06:47        Scheduled Meds: . free water  250 mL Oral Q6H  . heparin  5,000 Units Subcutaneous Q8H   Continuous Infusions: . sodium chloride    . ceFEPime (MAXIPIME) IV Stopped (03/18/20 2151)     LOS: 3 days    Time spent: 25 minutes    Danielly Ackerley, MD [facility]   To contact the attending provider between 7A-7P or the covering provider during after hours 7P-7A, please log into the web site [email or web address] and access using universal [place] password for that web site. If you do not have the password, please call the hospital operator.  03/19/2020, 3:11 PM

## 2020-03-19 NOTE — Evaluation (Signed)
Occupational Therapy Evaluation Patient Details Name: Pamela Fox MRN: [id number] DOB: [birth year] Today's Date: 03/19/2020    History of Present Illness Pt is an 86 y.o. female presenting to hospital 5/29 with AMS; pt admitted with sepsis with organ damage, UTI, acute renal failure, severe dehydration, and abnormal LFT's.  PMH includes dementia, h/o breast and colon CA, htn.   Clinical Impression   Pt was seen for OT evaluation this date. Prior to hospital admission, pt was apparently requiring assist for mobility and ADLs, primarily using w/c at facility and beign wheeled by staff when family came to visit. Pt lived with sister prior to 5/17 and was apparently able to walk without AD. Pt went to [place] starting 5/17 and was in the hospital the next date d/t a fall and since has had significantly limited mobility per pt's sister. Currently pt demonstrates impairments as described below (See OT problem list) which functionally limit her ability to perform ADL/self-care tasks. Pt currently requires MAX to TOTAL A for sup<>sit bed mobility, MOD A with UB ADLs at bed level with HOB elevated and MAX to TOTAL A with LB ADLs.  Pt would benefit from skilled OT to address noted impairments and functional limitations (see below for any additional details) in order to maximize safety and independence while minimizing falls risk and caregiver burden. Upon hospital discharge, recommend STR to maximize pt safety and return to PLOF.     Follow Up Recommendations  SNF;Supervision/Assistance - 24 hour    Equipment Recommendations  (defer to next level of care)    Recommendations for Other Services       Precautions / Restrictions Precautions Precautions: Fall Restrictions Weight Bearing Restrictions: No      Mobility Bed Mobility Overal bed mobility: Needs Assistance Bed Mobility: Supine to Sit;Sit to Supine     Supine to sit: Max assist;Total assist;HOB elevated Sit to supine: Max  assist;Total assist;HOB elevated   General bed mobility comments: Pt with very minmal effort to reach for railing/advance LEs toward EOB/back to bed with MAX tactile/verbal cues.  Transfers                 General transfer comment: NT d/t unsafe    Balance Overall balance assessment: Needs assistance   Sitting balance-Leahy Scale: Zero Sitting balance - Comments: MAX A to sustain static sitting balance, minimal to no effort noted from pt to sustain static sit       Standing balance comment: NT                           ADL either performed or assessed with clinical judgement   ADL Overall ADL's : Needs assistance/impaired                                       General ADL Comments: MOD A and MOD verbal cues for hand to mouth with cup with lid and straw. No effort towards UBLB dressing at bed level. MAX to TOTAL A for sup to sit and P sitting balance so no ADLs performed in sitting.     Vision   Additional Comments: difficult to formally assess d/t cognition. Pt with MIN/MOD visual attn throughout session, tracks minimally when attending.     Perception     Praxis      Pertinent Vitals/Pain Pain Assessment: Faces Faces Pain Scale: Hurts little  more Pain Location: no appearsnce of pain at rest, but grimaces with any attempt to mobilize Pain Descriptors / Indicators: Grimacing;Guarding Pain Intervention(s): Limited activity within patient's tolerance;Monitored during session     Hand Dominance     Extremity/Trunk Assessment Upper Extremity Assessment Upper Extremity Assessment: Difficult to assess due to impaired cognition(grip is 3+/5 MMT, unable to formalyl assess MMT/ROM of other aspects of UE ROM)   Lower Extremity Assessment Lower Extremity Assessment: Defer to PT evaluation;Difficult to assess due to impaired cognition   Cervical / Trunk Assessment Cervical / Trunk Assessment: Other exceptions Cervical / Trunk Exceptions: pt  preferring forward flexed trunk and neck positioning in bed   Communication Communication Communication: Other (comment)(pt speaks softly and limited verbalizations)   Cognition Arousal/Alertness: Awake/alert Behavior During Therapy: Flat affect Overall Cognitive Status: Impaired/Different from baseline Area of Impairment: Orientation;Memory;Following commands;Safety/judgement;Awareness;Attention;Problem solving                 Orientation Level: Disoriented to;Place;Time;Situation(only states "december" for her Bday, and oritented to self) Current Attention Level: Selective Memory: Decreased short-term memory Following Commands: Follows one step commands inconsistently Safety/Judgement: Decreased awareness of safety;Decreased awareness of deficits   Problem Solving: Decreased initiation;Difficulty sequencing;Slow processing;Requires verbal cues;Requires tactile cues General Comments: Pt with limited verbalizations or visual attn throughout. Pt's sister present and states before going to [place], she was generally oriented to family members with minimal cues, but has not been recognizing family at all x2 wks since moving into ALF. Could make needs known prior. On this date, confirms she would like a sip of water with MAX cues.   General Comments       Exercises Other Exercises Other Exercises: OT facilitates education with pt's sister who is present at start of assessment re: role of OT and pt's sister with good reception. Pt with no apparent recognition of education.   Shoulder Instructions      Home Living Family/patient expects to be discharged to:: Assisted living                             Home Equipment: Wheelchair - manual;Bedside commode   Additional Comments: [place]      Prior Functioning/Environment Level of Independence: Needs assistance  Gait / Transfers Assistance Needed: Per Deana (staff member at [employer]): pt 1-2 assist  with transfers (stand pivot); staff assists pt with propelling w/c; pt tends to slouch in w/c and tends to like to curl up in bed; pt also tends to resist movement intermittently as well (pt fearful of falling) ADL's / Homemaking Assistance Needed: pt can eat with plate in lap and using fingers but does not always eat; switches between using BSC vs needing clean-up for toileting   Comments: Pt's sister present for part of OT eval and reports that pt first went to [place] on 5/17, fell and came to hospital, then back to [place] on 5/18. Was able to walk with no AD and now unable to walk. Sister reports she is unsure if pt was getting any therapy at facility. Pt's sister reports she was living with pt prior.        OT Problem List: Decreased strength;Decreased range of motion;Decreased activity tolerance;Impaired balance (sitting and/or standing);Decreased cognition;Decreased safety awareness;Decreased knowledge of use of DME or AE;Decreased knowledge of precautions;Pain      OT Treatment/Interventions: Self-care/ADL training;Therapeutic exercise;DME and/or AE instruction;Therapeutic activities;Patient/family education;Balance training    OT Goals(Current goals can  be found in the care plan section) Acute Rehab OT Goals Patient Stated Goal: none stated OT Goal Formulation: Patient unable to participate in goal setting Time For Goal Achievement: 04/02/20 Potential to Achieve Goals: Fair  OT Frequency: Min 1X/week   Barriers to D/C:            Co-evaluation              AM-PAC OT "6 Clicks" Daily Activity     Outcome Measure Help from another person eating meals?: A Lot Help from another person taking care of personal grooming?: A Lot Help from another person toileting, which includes using toliet, bedpan, or urinal?: Total Help from another person bathing (including washing, rinsing, drying)?: Total Help from another person to put on and taking off regular upper body  clothing?: A Lot Help from another person to put on and taking off regular lower body clothing?: Total 6 Click Score: 9   End of Session    Activity Tolerance: Patient limited by fatigue;Patient limited by lethargy Patient left: in bed;with call bell/phone within reach;with bed alarm set  OT Visit Diagnosis: Unsteadiness on feet (R26.81);Muscle weakness (generalized) (M62.81);Other symptoms and signs involving cognitive function                Time: [id number] OT Time Calculation (min): 16 min Charges:  OT General Charges $OT Visit: 1 Visit OT Evaluation $OT Eval Moderate Complexity: [place], Laura, OTR/L ascom [phone] 03/19/20, 4:38 PM

## 2020-03-20 DIAGNOSIS — R7401 Elevation of levels of liver transaminase levels: Secondary | ICD-10-CM

## 2020-03-20 LAB — COMPREHENSIVE METABOLIC PANEL
ALT: 150 U/L — ABNORMAL HIGH (ref 0–44)
AST: 94 U/L — ABNORMAL HIGH (ref 15–41)
Albumin: 2.3 g/dL — ABNORMAL LOW (ref 3.5–5.0)
Alkaline Phosphatase: 51 U/L (ref 38–126)
Anion gap: 8 (ref 5–15)
BUN: 11 mg/dL (ref 8–23)
CO2: 27 mmol/L (ref 22–32)
Calcium: 7.6 mg/dL — ABNORMAL LOW (ref 8.9–10.3)
Chloride: 107 mmol/L (ref 98–111)
Creatinine, Ser: 0.64 mg/dL (ref 0.44–1.00)
GFR calc Af Amer: >60 mL/min (ref >60)
GFR calc non Af Amer: >60 mL/min (ref >60)
Glucose, Bld: 95 mg/dL (ref 70–99)
Potassium: 3.3 mmol/L — ABNORMAL LOW (ref 3.5–5.1)
Sodium: 142 mmol/L (ref 135–145)
Total Bilirubin: 0.7 mg/dL (ref 0.3–1.2)
Total Protein: 5.2 g/dL — ABNORMAL LOW (ref 6.5–8.1)

## 2020-03-20 LAB — RPR: RPR Ser Ql: NONREACTIVE

## 2020-03-20 MED ORDER — CEPHALEXIN 500 MG PO CAPS: 500.0000 mg | ORAL_CAPSULE | Freq: Three times a day (TID) | ORAL | 0 refills | Status: AC

## 2020-03-20 MED ORDER — CEPHALEXIN 500 MG PO CAPS: 500.0000 mg | ORAL_CAPSULE | Freq: Three times a day (TID) | ORAL | Status: DC

## 2020-03-20 NOTE — Discharge Summary (Signed)
[place] [facility] [place] NAME: Pamela Fox    MR#:  [id number]  DATE OF BIRTH:  1934/01/20  DATE OF ADMISSION:  03/16/2020 ADMITTING PHYSICIAN: Ava Swayze, DO  DATE OF DISCHARGE: 03/20/2020  PRIMARY CARE PHYSICIAN: Delsa Grana, PA-C    ADMISSION DIAGNOSIS:  Lactic acidosis [E87.2] Transaminitis [R74.01] Sepsis ([place]) [A41.9] Urinary tract infection without hematuria, site unspecified [N39.0] Acute renal failure, unspecified acute renal failure type ([place]) [N17.9] Altered mental status, unspecified altered mental status type [R41.82]  DISCHARGE DIAGNOSIS:  sepsis on admission resolved Transaminitis likely due to sepsis improving Advance Dementia Acute renal failure/hypernatremia--improved SECONDARY DIAGNOSIS:   Past Medical History:  Diagnosis Date  . Dementia [facility])     HOSPITAL COURSE:   86 year old female with history of dementia, osteoporosis, breast and colon cancer, hypertension brought from SNF with altered mental status. As per the sister patient was found to be confused and somnolent when she visited her on the day of admission. She appeared to be having chills. When she was brought to the ED she was not communicating. Patient had positive UA and was septic on admission   Sepsis without septic shock ([place]) Likely secondary to UTI. Lactic acid improved with IV fluids. Sepsis currently resolved.  -Continue empiric cefepime--changed to oral keflex -BC negative -UC multiple species -LFTs slowly trending down.  Acute kidney injury ([place]) Suspect prerenal ATN with sepsis and UTI.Renal function improved with normal saline and D5 water--resolved  Transaminitis Suspect shock liver with sepsis. LFTs trending down.Hepatitis panel negative.Abdominal ultrasound showed cholelithiasis without acute cholecystitis or biliary ductal dilatation. Monitor daily.  Hypernatremia Secondary to dehydration.Improved in a.m. lab with  fluids switched to D5. Continue free water.   Dementia, moderate to severe Appears to have worsened symptoms with encephalopathy. Avoid benzos or narcotics. Patient more awake but still confused. As per her sister her dementia seems to be worsening.  Stage I pressure injury to the sacrum Care per nursing.  Hypokalemia/hypomagnesemia Replenished.   DVT prophylaxis:Subcu Lovenox Code Status:DNR per sister [name] HCPOA Family Communication:Sister updated on the phone Disposition:to [facility]  Patient sister Ms. [name] is aware of patient's worsening dementia according to her and at a high risk for readmission for similar issues. Long term poor prognosis   CONSULTS OBTAINED:    DRUG ALLERGIES:  No Known Allergies  DISCHARGE MEDICATIONS:   Allergies as of 03/20/2020   No Known Allergies     Medication List    TAKE these medications   cephALEXin 500 MG capsule Commonly known as: KEFLEX Take 1 capsule (500 mg total) by mouth every 8 (eight) hours for 3 days.       If you experience worsening of your admission symptoms, develop shortness of breath, life threatening emergency, suicidal or homicidal thoughts you must seek medical attention immediately by calling 911 or calling your MD immediately  if symptoms less severe.  You Must read complete instructions/literature along with all the possible adverse reactions/side effects for all the Medicines you take and that have been prescribed to you. Take any new Medicines after you have completely understood and accept all the possible adverse reactions/side effects.   Please note  You were cared for by a hospitalist during your hospital stay. If you have any questions about your discharge medications or the care you received while you were in the hospital after you are discharged, you can call the unit and asked to speak with the hospitalist on call if the hospitalist that took  care of you is not available. Once you are  discharged, your primary care physician will handle any further medical issues. Please note that NO REFILLS for any discharge medications will be authorized once you are discharged, as it is imperative that you return to your primary care physician (or establish a relationship with a primary care physician if you do not have one) for your aftercare needs so that they can reassess your need for medications and monitor your lab values.   DATA REVIEW:   CBC  Recent Labs  Lab 03/18/20 0449  WBC 13.8*  HGB 10.9*  HCT 33.7*  PLT 114*    Chemistries  Recent Labs  Lab 03/18/20 0449 03/18/20 0833 03/20/20 0442  NA 153*   < > 142  K 3.4*   < > 3.3*  CL 121*   < > 107  CO2 25   < > 27  GLUCOSE 72   < > 95  BUN 43*   < > 11  CREATININE 1.05*   < > 0.64  CALCIUM 7.6*   < > 7.6*  MG 1.8  --   --   AST  --    < > 94*  ALT  --    < > 150*  ALKPHOS  --    < > 51  BILITOT  --    < > 0.7   < > = values in this interval not displayed.    Microbiology Results   Recent Results (from the past 240 hour(s))  Urine culture     Status: Abnormal   Collection Time: 03/16/20  3:39 PM   Specimen: Urine, Random  Result Value Ref Range Status   Specimen Description   Final    URINE, RANDOM Performed at [facility], [address] [place], [place] [address]    Special Requests   Final    NONE Performed at [facility], [address] [place], [place] [address]    Culture MULTIPLE SPECIES PRESENT, SUGGEST RECOLLECTION (A)  Final   Report Status 03/18/2020 FINAL  Final  Blood Culture (routine x 2)     Status: None (Preliminary result)   Collection Time: 03/16/20  4:17 PM   Specimen: BLOOD  Result Value Ref Range Status   Specimen Description BLOOD BLOOD RIGHT ARM  Final   Special Requests   Final    BOTTLES DRAWN AEROBIC AND ANAEROBIC Blood Culture adequate volume   Culture   Final    NO GROWTH 4 DAYS Performed at [facility], [address],  [place], [place] [address]    Report Status PENDING  Incomplete  Blood Culture (routine x 2)     Status: None (Preliminary result)   Collection Time: 03/16/20  4:17 PM   Specimen: BLOOD  Result Value Ref Range Status   Specimen Description BLOOD RIGHT ARM  Final   Special Requests   Final    BOTTLES DRAWN AEROBIC AND ANAEROBIC Blood Culture adequate volume   Culture   Final    NO GROWTH 4 DAYS Performed at [facility], [address] [place], [place] [address]    Report Status PENDING  Incomplete  SARS Coronavirus 2 by RT PCR (hospital order, performed in [place] hospital lab) Nasopharyngeal Nasopharyngeal Swab     Status: None   Collection Time: 03/16/20  4:17 PM   Specimen: Nasopharyngeal Swab  Result Value Ref Range Status   SARS Coronavirus 2 NEGATIVE NEGATIVE Final    Comment: (  NOTE) SARS-CoV-2 target nucleic acids are NOT DETECTED. The SARS-CoV-2 RNA is generally detectable in upper and lower respiratory specimens during the acute phase of infection. The lowest concentration of SARS-CoV-2 viral copies this assay can detect is 250 copies / mL. A negative result does not preclude SARS-CoV-2 infection and should not be used as the sole basis for treatment or other patient management decisions.  A negative result may occur with improper specimen collection / handling, submission of specimen other than nasopharyngeal swab, presence of viral mutation(s) within the areas targeted by this assay, and inadequate number of viral copies (<250 copies / mL). A negative result must be combined with clinical observations, patient history, and epidemiological information. Fact Sheet for Patients:   StrictlyIdeas.no Fact Sheet for Healthcare Providers: BankingDealers.co.za This test is not yet approved or cleared  by the [place] FDA and has been authorized for detection and/or diagnosis of SARS-CoV-2 by FDA under an Emergency  Use Authorization (EUA).  This EUA will remain in effect (meaning this test can be used) for the duration of the COVID-19 declaration under Section 564(b)(1) of the Act, 21 U.S.C. section 360bbb-3(b)(1), unless the authorization is terminated or revoked sooner. Performed at [facility], [address] [place], [place] [address]     RADIOLOGY:  No results found.   CODE STATUS:     Code Status Orders  (From admission, onward)         Start     Ordered   03/20/20 1442  Do not attempt resuscitation (DNR)  Continuous    Question Answer Comment  In the event of cardiac or respiratory ARREST Do not call a "code blue"   In the event of cardiac or respiratory ARREST Do not perform Intubation, CPR, defibrillation or ACLS   In the event of cardiac or respiratory ARREST Use medication by any route, position, wound care, and other measures to relive pain and suffering. May use oxygen, suction and manual treatment of airway obstruction as needed for comfort.   Comments per sister [name]      03/20/20 1441        Code Status History    Date Active Date Inactive Code Status Order ID Comments User Context   03/16/2020 2059 03/20/2020 1441 Full Code [id number]  Elwyn Reach, MD ED   03/16/2020 1919 03/16/2020 2059 DNR [id number]  Elwyn Reach, MD ED   Advance Care Planning Activity       TOTAL TIME TAKING CARE OF THIS PATIENT: 40 minutes.    [name] M.D  [facility]    CC: Primary care physician; Delsa Grana, PA-C

## 2020-03-20 NOTE — Progress Notes (Signed)
03/20/2020 6:54 PM  Called patient's sister [name] to let her know that the patient has left with EMS for [facility].  Dola Argyle, RN

## 2020-03-20 NOTE — Progress Notes (Signed)
03/20/2020 6:55 PM  Margy Clarks to be D/C'd Skilled nursing facility per MD order.  Discussed prescriptions and follow up appointments with the patient. Prescriptions given to patient, medication list explained in detail. Pt verbalized understanding.  Allergies as of 03/20/2020   No Known Allergies     Medication List    TAKE these medications   cephALEXin 500 MG capsule Commonly known as: KEFLEX Take 1 capsule (500 mg total) by mouth every 8 (eight) hours for 3 days.       Vitals:   03/20/20 0455 03/20/20 1152  BP: 138/60 (!) 150/78  Pulse: 91 (!) 106  Resp: 18 20  Temp: 98 F (36.7 C) 97.6 F (36.4 C)  SpO2: 96% 98%    Skin clean, dry and intact without evidence of skin break down, no evidence of skin tears noted. IV catheter discontinued intact. Site without signs and symptoms of complications. Dressing and pressure applied. Pt denies pain at this time. No complaints noted.  An After Visit Summary was printed and given to the patient. Patient D/C via EMS.    Dola Argyle

## 2020-03-20 NOTE — Progress Notes (Signed)
[place] at [place] NAME: Pamela Fox    MR#:  [id number]  DATE OF BIRTH:  October 30, 1933  SUBJECTIVE:   Patient has dementia at baseline. Opens eyes to verbal commands. Does not have meaningful conversation. Staff reports patient drinking and eating food when offered. REVIEW OF SYSTEMS:   Review of Systems  Unable to perform ROS: Dementia   Tolerating Diet:yes Tolerating PT: rec rehab  DRUG ALLERGIES:  No Known Allergies  VITALS:  Blood pressure (!) 150/78, pulse (!) 106, temperature 97.6 F (36.4 C), temperature source Oral, resp. rate 20, height 5\' 3"  (1.6 m), weight 63 kg, SpO2 98 %.  PHYSICAL EXAMINATION:   Physical Exam  GENERAL:  86 y.o.-year-old patient lying in the bed with no acute distress.   LUNGS: Normal breath sounds bilaterally, no wheezing, rales, rhonchi. No use of accessory muscles of respiration.  CARDIOVASCULAR: S1, S2 normal. No murmurs, rubs, or gallops.  ABDOMEN: Soft, nontender, nondistended. Bowel sounds present. No organomegaly or mass.  EXTREMITIES: No cyanosis, clubbing or edema b/l.    NEUROLOGIC: moves all extremities well spontaneously. PSYCHIATRIC:  patient is alert and dementia at baseline  SKIN:  Pressure Injury 03/17/20 Thigh Anterior;Proximal;Right Stage 1 -  Intact skin with non-blanchable redness of a localized area usually over a bony prominence. pink (Active)  03/17/20 1747  Location: Thigh  Location Orientation: Anterior;Proximal;Right  Staging: Stage 1 -  Intact skin with non-blanchable redness of a localized area usually over a bony prominence.  Wound Description (Comments): pink  Present on Admission: Yes     Pressure Injury 03/17/20 Thigh Anterior;Left;Proximal Stage 1 -  Intact skin with non-blanchable redness of a localized area usually over a bony prominence. pink (Active)  03/17/20 1748  Location: Thigh  Location Orientation: Anterior;Left;Proximal  Staging: Stage 1 -  Intact  skin with non-blanchable redness of a localized area usually over a bony prominence.  Wound Description (Comments): pink  Present on Admission: Yes      LABORATORY PANEL:  CBC Recent Labs  Lab 03/18/20 0449  WBC 13.8*  HGB 10.9*  HCT 33.7*  PLT 114*    Chemistries  Recent Labs  Lab 03/18/20 0449 03/18/20 0833 03/20/20 0442  NA 153*   < > 142  K 3.4*   < > 3.3*  CL 121*   < > 107  CO2 25   < > 27  GLUCOSE 72   < > 95  BUN 43*   < > 11  CREATININE 1.05*   < > 0.64  CALCIUM 7.6*   < > 7.6*  MG 1.8  --   --   AST  --    < > 94*  ALT  --    < > 150*  ALKPHOS  --    < > 51  BILITOT  --    < > 0.7   < > = values in this interval not displayed.   Cardiac Enzymes No results for input(s): TROPONINI in the last 168 hours. RADIOLOGY:  No results found. ASSESSMENT AND PLAN:  86 year old female with history of dementia, osteoporosis, breast and colon cancer, hypertension brought from SNF with altered mental status.  As per the sister patient was found to be confused and somnolent when she visited her on the day of admission.  She appeared to be having chills.  When she was brought to the ED she was not communicating.  Patient had positive UA and was septic on admission  Sepsis without septic shock ([place]) Likely secondary to UTI. Lactic acid improved with IV fluids.  Sepsis currently resolved.  Continue empiric cefepime--change to oral keflex -BC negative UC multiple species LFTs slowly trending down. Encephalopathy improving.   Acute kidney injury ([place]) Suspect prerenal ATN with sepsis and UTI. Renal function improved with normal saline and D5 water--resolved  Transaminitis Suspect shock liver with sepsis.  LFTs trending down. Hepatitis panel negative. Abdominal ultrasound showed cholelithiasis without acute cholecystitis or biliary ductal dilatation.  Monitor daily.  Hypernatremia Secondary to dehydration. Improved in a.m. lab with fluids switched to D5. Continue free  water.   Dementia, moderate to severe Appears to have worsened symptoms with encephalopathy.  Avoid benzos or narcotics.  Patient more awake but still confused. As per her sister her dementia seems to be worsening.  Stage I pressure injury to the sacrum Care per nursing.  Hypokalemia/hypomagnesemia Replenished.   DVT prophylaxis: Subcu Lovenox Code Status: DNR per sister Pamela Fox HCPOA Family Communication: Sister updated on the phone Disposition:   Status is: Inpatient  Dispo: The patient is from: SNF  Anticipated d/c is to: SNF  Anticipated d/c date is: when bed and insurance auth obtained  Patient currently is  medically stable for discharge         TOTAL TIME TAKING CARE OF THIS PATIENT: *30* minutes.  >50% time spent on counselling and coordination of care  Note: This dictation was prepared with Dragon dictation along with smaller phrase technology. Any transcriptional errors that result from this process are unintentional.  [name] M.D    [facility]   CC: Primary care physician; Delsa Grana, PA-CPatient ID: Pamela Fox, female   DOB: Sep 16, 1934, 86 y.o.   MRN: [id number]

## 2020-03-20 NOTE — TOC Transition Note (Signed)
Transition of Care [facility]) - CM/SW Discharge Note   Patient Details  Name: Pamela Fox MRN: [id number] Date of Birth: 1934-07-13  [facility] CM/SW Contact:  Beverly Sessions, RN Phone Number: 03/20/2020, 3:57 PM   Clinical Narrative:     Patient to discharge today. Only facility that is able to offer a bed is [facility]. Sister is aware, and has accepted the bed.  DC info sent in the [place].  EMS packet printed.  Bedside RN notified Bedside RN to call report and EMS  Final next level of care: Skilled Nursing Facility Barriers to Discharge: No Barriers Identified   Patient Goals and CMS Choice        Discharge Placement              Patient chooses bed at: [facility] Patient to be transferred to facility by: EMS Name of family member notified: [name] Patient and family notified of of transfer: 03/20/20  Discharge Plan and Services                                     Social Determinants of Health (SDOH) Interventions     Readmission Risk Interventions No flowsheet data found.

## 2020-03-20 NOTE — Discharge Instructions (Signed)
Please offer pt food and water periodically at the rehab place

## 2020-03-21 LAB — CULTURE, BLOOD (ROUTINE X 2)
Culture: NO GROWTH
Culture: NO GROWTH
Special Requests: ADEQUATE
Special Requests: ADEQUATE

## 2020-03-21 LAB — BLOOD GAS, VENOUS
Patient temperature: 37
pCO2, Ven: 61 mmHg — ABNORMAL HIGH (ref 44.0–60.0)
pH, Ven: 7.29 (ref 7.250–7.430)

## 2020-05-19 DEATH — deceased

## 2021-06-26 IMAGING — CT CT HEAD W/O CM
3 series · 15 of 45 positions shown, 18 images · non-contrast
Comparison: March 05, 2020

CLINICAL DATA: Encephalopathy.  The client in mental status.

EXAM:
CT HEAD WITHOUT CONTRAST
TECHNIQUE: Contiguous axial images were obtained from the base of the skull
through the vertex without intravenous contrast.

[Series 2: head wo · axial · 0.47mm/px · z∈[-74,+41]mm · 9 of 28 slices shown, 12 images]
[im 3/28  brain]
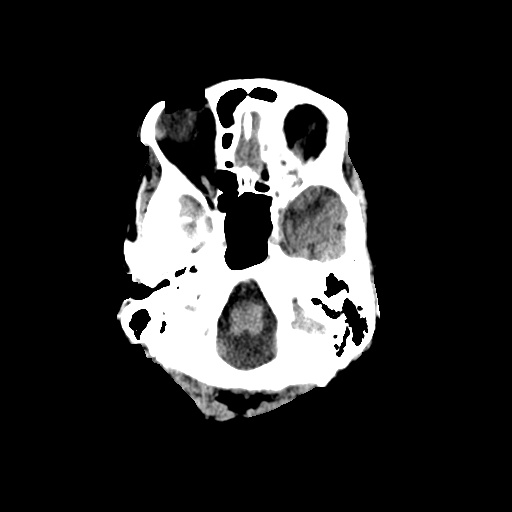
[im 3/28  bone]
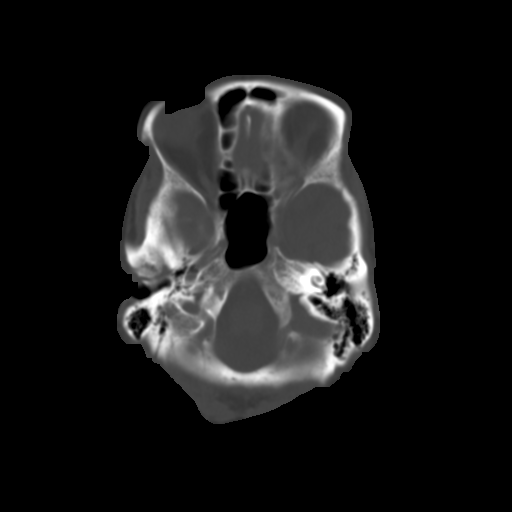
[im 6/28  brain]
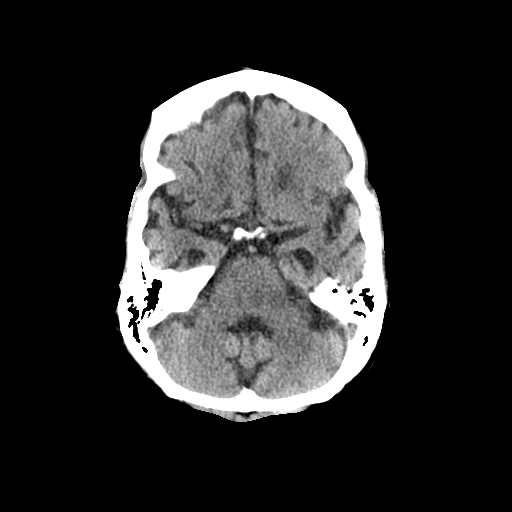
[im 9/28  brain]
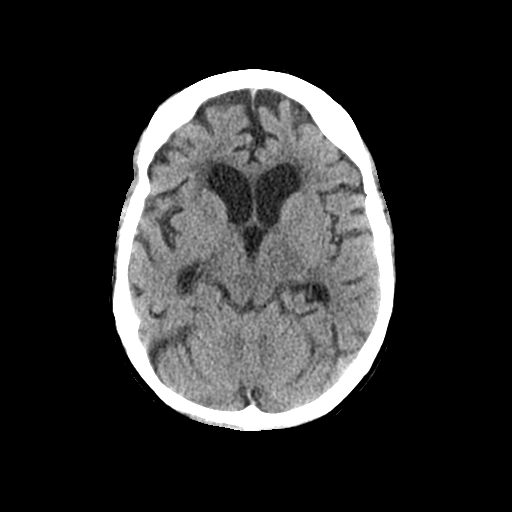
[im 12/28  brain]
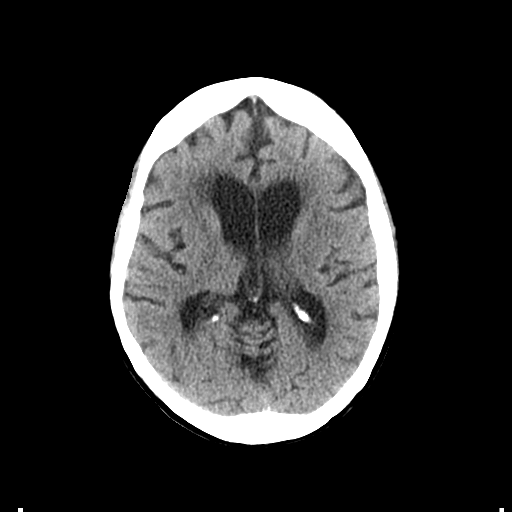
[im 15/28  brain]
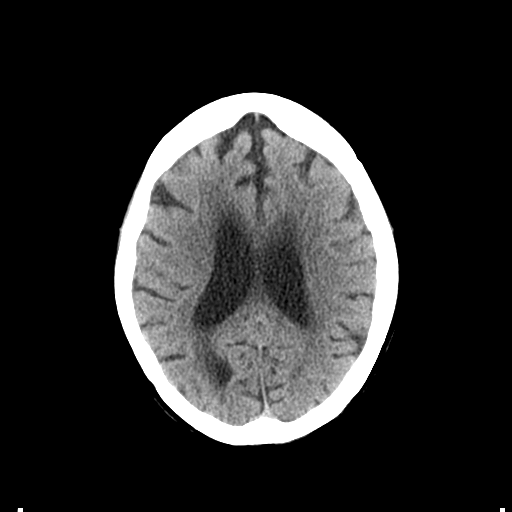
[im 15/28  bone]
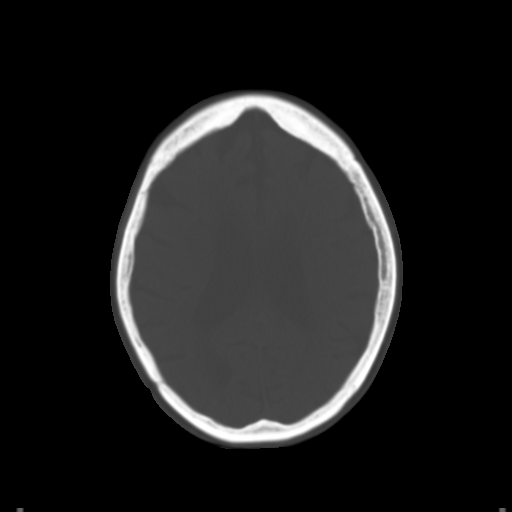
[im 17/28  brain]
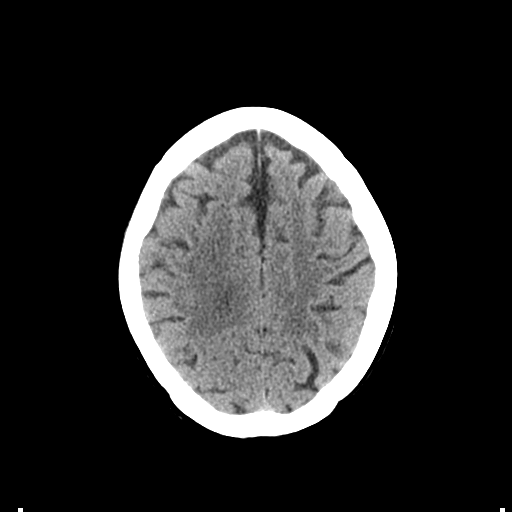
[im 20/28  brain]
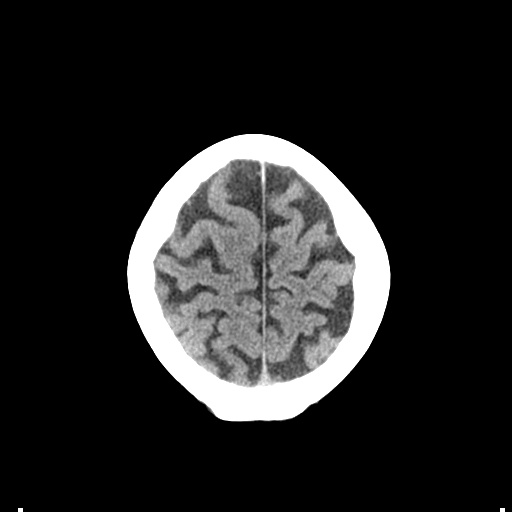
[im 23/28  brain]
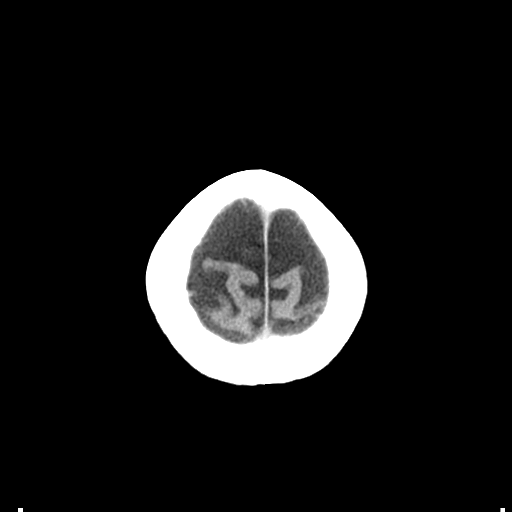
[im 26/28  brain]
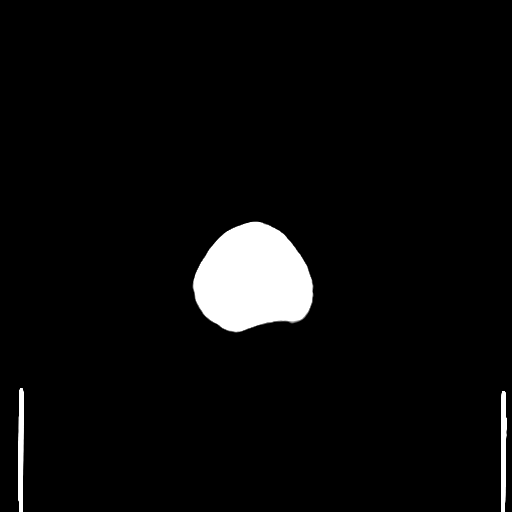
[im 26/28  bone]
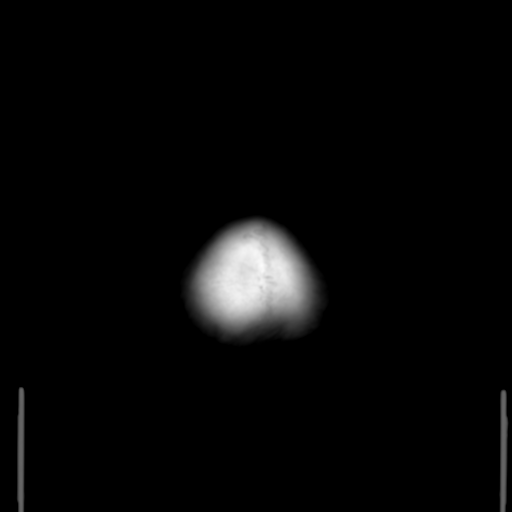

[Series 4: coronal soft tissue · coronal · 0.27mm/px · 3 of 64 slices shown]
[im 22/64  brain]
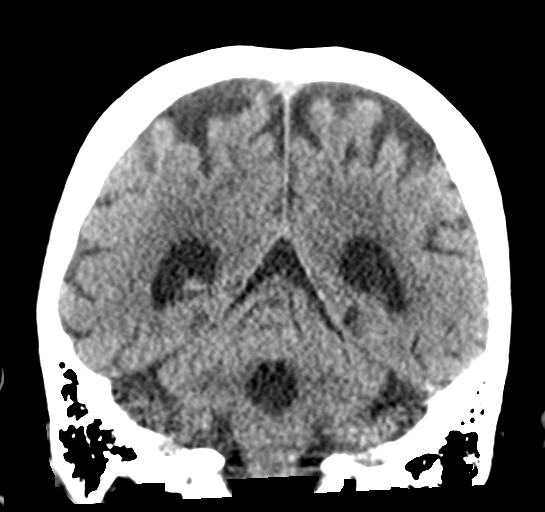
[im 29/64  brain]
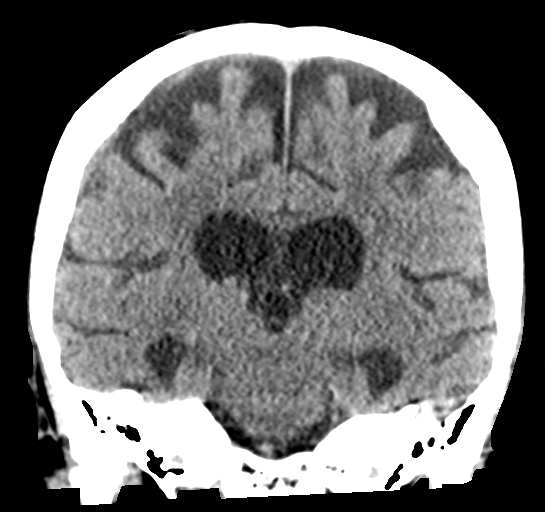
[im 36/64  brain]
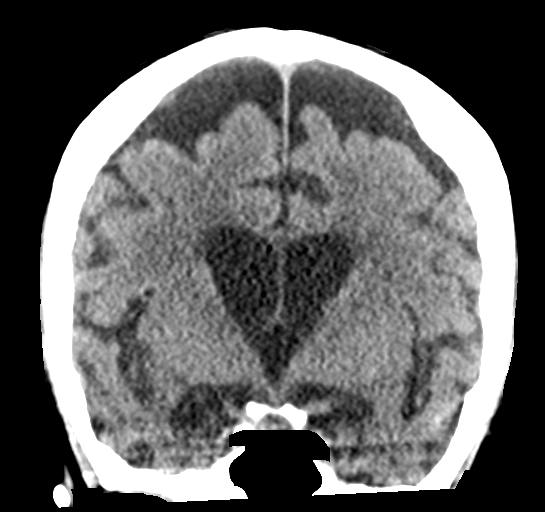

[Series 5: sagittal soft tissue · sagittal · 0.27mm/px · 3 of 47 slices shown]
[im 16/47  brain]
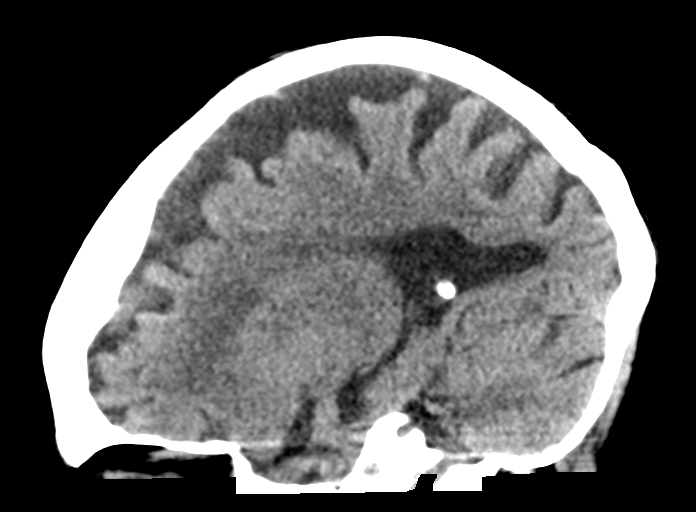
[im 24/47  brain]
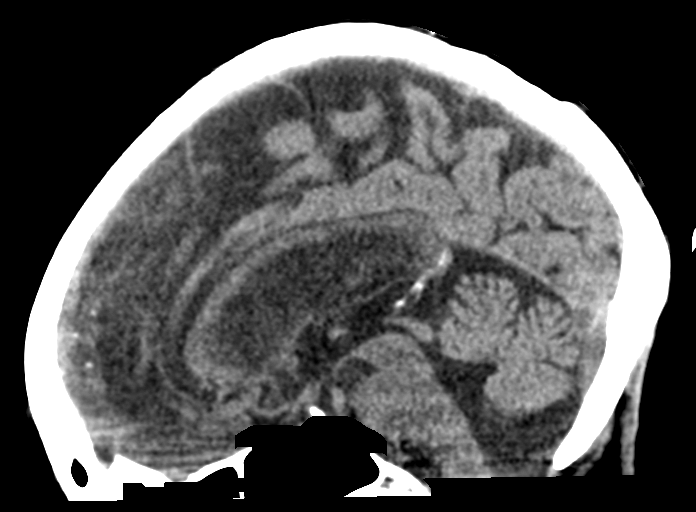
[im 31/47  brain]
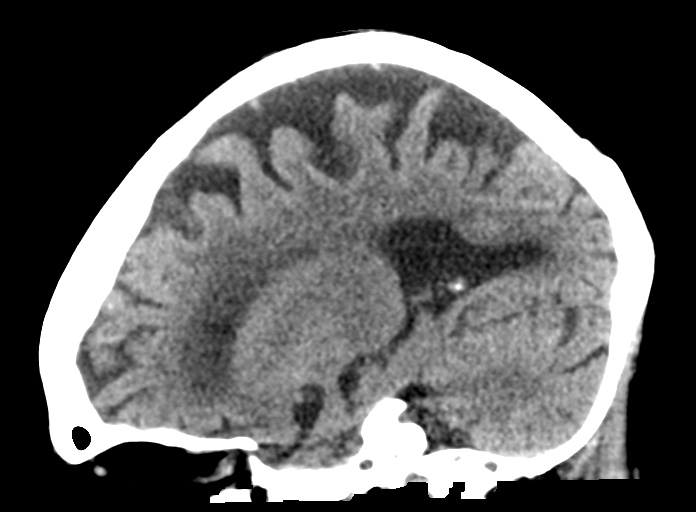

[15 of 45 positions shown; findings below may reference images not displayed]

FINDINGS: Brain: No subdural, epidural, or subarachnoid hemorrhage.
Cerebellum, brainstem, and basal cisterns are normal. Ventricles and
sulci remain prominent but stable. White matter changes are
unchanged. No acute cortical ischemia or infarct. No mass effect or
midline shift.

Vascular: Calcified atherosclerosis is seen in the intracranial
carotids.

Skull: Normal. Negative for fracture or focal lesion.

Sinuses/Orbits: No acute finding.

Other: None.
IMPRESSION: 1. Chronic white matter changes. No acute intracranial
abnormalities.
